# Patient Record
Sex: Female | Born: 1935 | Race: White | Hispanic: No | Smoking: Current every day smoker
Health system: Southern US, Community
[De-identification: ages and names within clinical notes are randomized; demographics above are authoritative.]

## PROBLEM LIST (undated history)

## (undated) DIAGNOSIS — I10 Essential (primary) hypertension: Secondary | ICD-10-CM

## (undated) DIAGNOSIS — C50919 Malignant neoplasm of unspecified site of unspecified female breast: Secondary | ICD-10-CM

## (undated) DIAGNOSIS — J449 Chronic obstructive pulmonary disease, unspecified: Secondary | ICD-10-CM

## (undated) DIAGNOSIS — D510 Vitamin B12 deficiency anemia due to intrinsic factor deficiency: Secondary | ICD-10-CM

## (undated) DIAGNOSIS — E039 Hypothyroidism, unspecified: Secondary | ICD-10-CM

## (undated) HISTORY — PX: TONSILLECTOMY: SUR1361

## (undated) HISTORY — DX: Essential (primary) hypertension: I10

## (undated) HISTORY — DX: Chronic obstructive pulmonary disease, unspecified: J44.9

## (undated) HISTORY — DX: Vitamin B12 deficiency anemia due to intrinsic factor deficiency: D51.0

## (undated) HISTORY — DX: Malignant neoplasm of unspecified site of unspecified female breast: C50.919

## (undated) HISTORY — PX: MASTECTOMY: SHX3

## (undated) HISTORY — DX: Hypothyroidism, unspecified: E03.9

---

## 2014-05-22 LAB — LIPID PANEL
HDL: 41 mg/dL (ref 35–70)
LDL Cholesterol: 144 mg/dL
Triglycerides: 108 mg/dL (ref 40–160)

## 2014-05-22 LAB — BASIC METABOLIC PANEL
Creatinine: 1.2 mg/dL — AB (ref 0.5–1.1)
Glucose: 73 mg/dL

## 2014-05-22 LAB — CBC AND DIFFERENTIAL
HEMOGLOBIN: 14.3 g/dL (ref 12.0–16.0)
Platelets: 202 10*3/uL (ref 150–399)
WBC: 6.2 10*3/mL

## 2015-05-13 ENCOUNTER — Ambulatory Visit (INDEPENDENT_AMBULATORY_CARE_PROVIDER_SITE_OTHER): Payer: Medicare Other | Admitting: Family Medicine

## 2015-05-13 ENCOUNTER — Encounter: Payer: Self-pay | Admitting: Family Medicine

## 2015-05-13 VITALS — BP 136/74 | HR 109 | Ht 65.0 in | Wt 177.0 lb

## 2015-05-13 DIAGNOSIS — E038 Other specified hypothyroidism: Secondary | ICD-10-CM | POA: Diagnosis not present

## 2015-05-13 DIAGNOSIS — I10 Essential (primary) hypertension: Secondary | ICD-10-CM

## 2015-05-13 DIAGNOSIS — C50912 Malignant neoplasm of unspecified site of left female breast: Secondary | ICD-10-CM

## 2015-05-13 DIAGNOSIS — N289 Disorder of kidney and ureter, unspecified: Secondary | ICD-10-CM

## 2015-05-13 DIAGNOSIS — E039 Hypothyroidism, unspecified: Secondary | ICD-10-CM | POA: Insufficient documentation

## 2015-05-13 DIAGNOSIS — C50919 Malignant neoplasm of unspecified site of unspecified female breast: Secondary | ICD-10-CM

## 2015-05-13 DIAGNOSIS — D510 Vitamin B12 deficiency anemia due to intrinsic factor deficiency: Secondary | ICD-10-CM | POA: Diagnosis not present

## 2015-05-13 DIAGNOSIS — R221 Localized swelling, mass and lump, neck: Secondary | ICD-10-CM

## 2015-05-13 HISTORY — DX: Malignant neoplasm of unspecified site of unspecified female breast: C50.919

## 2015-05-13 HISTORY — DX: Vitamin B12 deficiency anemia due to intrinsic factor deficiency: D51.0

## 2015-05-13 NOTE — Progress Notes (Signed)
CC: Alexandria Pittman is a 79 y.o. female is here for Establish Care   Subjective: HPI:  Very pleasant 79 year old here to establish care  History of essential hypertension currently taking amlodipine and hydrochlorothiazide. She would like to know she can stop either of these medications. She denies any known side effects, she finds taking medication annoying.  History of hypothyroidism stemming back an unknown amount of years. She is taking 100 MCG of levothyroxine, she believes back in February she was switched from 80 MCG daily due to abnormal labs. She's had some swelling in the right anterior neck that her daughter has also noticed. Patient tells me that if her head is not directed forward she has difficulty swallowing. She is uncertain how long the swelling and present it seems to be painless. It seems to be growing.  History of pernicious anemia currently seen hematologist to get B12 injections. Hemoglobin back in June was normal  History of breast cancer requiring a mastectomy in 2014. She was only able to complete 8 out of 15 cycles of chemotherapy due to malnourishment and severe dehydration. Ever since chemotherapy she's noticed that her short-term memory is impaired. Short-term memory does not seem to be declining, current impairment seems to be fixed and unwavering.  Report a history of renal insufficiency and tumor requiring radioablation on the left kidney many years ago. Family is requesting that liver function and kidney function be checked today.   Review of Systems - General ROS: negative for - chills, fever, night sweats, weight gain or weight loss Ophthalmic ROS: negative for - decreased vision Psychological ROS: negative for - anxiety or depression ENT ROS: negative for - hearing change, nasal congestion, tinnitus or allergies Hematological and Lymphatic ROS: negative for - bleeding problems, bruising or swollen lymph nodes Breast ROS: negative Respiratory ROS: no cough,  shortness of breath, or wheezing Cardiovascular ROS: no chest pain or dyspnea on exertion Gastrointestinal ROS: no abdominal pain, change in bowel habits, or black or bloody stools Genito-Urinary ROS: negative for - genital discharge, genital ulcers, incontinence or abnormal bleeding from genitals Musculoskeletal ROS: negative for - joint pain or muscle pain Neurological ROS: negative for - headaches or memory loss Dermatological ROS: negative for lumps, mole changes, rash and skin lesion changes  Past Medical History  Diagnosis Date  . Pernicious anemia 05/13/2015  . Breast cancer, female 05/13/2015    No past surgical history on file. No family history on file.  History   Social History  . Marital Status: N/A    Spouse Name: N/A  . Number of Children: N/A  . Years of Education: N/A   Occupational History  . Not on file.   Social History Main Topics  . Smoking status: Current Every Day Smoker  . Smokeless tobacco: Not on file  . Alcohol Use: Not on file  . Drug Use: Not on file  . Sexual Activity: Not on file   Other Topics Concern  . Not on file   Social History Narrative  . No narrative on file     Objective: BP 136/74 mmHg  Pulse 109  Ht 5\' 5"  (1.651 m)  Wt 177 lb (80.287 kg)  BMI 29.45 kg/m2  General: Alert and Oriented, No Acute Distress HEENT: Pupils equal, round, reactive to light. Conjunctivae clear.  Moist mucous membranes, pharynx without inflammation nor lesions. Midline trachea, firm peanut-sized mass just above the right medial clavicle  Lungs: Clear to auscultation bilaterally, no wheezing/ronchi/rales.  Comfortable work of breathing. Good air  movement. Cardiac: Regular rate and rhythm. Normal S1/S2.  No murmurs, rubs, nor gallops.   Extremities: No peripheral edema.  Strong peripheral pulses.  Mental Status: No depression, anxiety, nor agitation. Skin: Warm and dry.  Assessment & Plan: Alexandria Pittman was seen today for establish care.  Diagnoses and all  orders for this visit:  Essential hypertension Orders: -     COMPLETE METABOLIC PANEL WITH GFR  Other specified hypothyroidism Orders: -     TSH  Pernicious anemia  Breast cancer, female, left  Neck mass Orders: -     US Soft Tissue Head/Neck  Renal insufficiency Orders: -     COMPLETE METABOLIC PANEL WITH GFR   Essential hypertension: Control, checking renal function, reassurance provided that HCTZ and amlodipine are doing their job at keeping her blood pressure under 140/90, encouraged to continue current regimen Hypothyroidism: Due for TSH continue 100 MCG of the proximal and pending results Pernicious anemia: Managed by hematology History of breast cancer: Up-to-date on mammogram as of the beginning of this year Neck mass: Obtain ultrasound of the neck, discussed importance of this Renal insufficiency: Checking renal function.  58minutes spent face-to-face during visit today of which at least 50% was counseling or coordinating care regarding: 1. Essential hypertension   2. Other specified hypothyroidism   3. Pernicious anemia   4. Breast cancer, female, left   5. Neck mass   6. Renal insufficiency      Return in about 3 months (around 08/13/2015).

## 2015-05-16 DIAGNOSIS — H3532 Exudative age-related macular degeneration: Secondary | ICD-10-CM | POA: Diagnosis not present

## 2015-05-16 DIAGNOSIS — H3531 Nonexudative age-related macular degeneration: Secondary | ICD-10-CM | POA: Diagnosis not present

## 2015-05-23 ENCOUNTER — Encounter: Payer: Self-pay | Admitting: Family Medicine

## 2015-05-23 DIAGNOSIS — I10 Essential (primary) hypertension: Secondary | ICD-10-CM | POA: Diagnosis not present

## 2015-05-23 DIAGNOSIS — N289 Disorder of kidney and ureter, unspecified: Secondary | ICD-10-CM | POA: Diagnosis not present

## 2015-05-23 DIAGNOSIS — E038 Other specified hypothyroidism: Secondary | ICD-10-CM | POA: Diagnosis not present

## 2015-05-23 DIAGNOSIS — J449 Chronic obstructive pulmonary disease, unspecified: Secondary | ICD-10-CM | POA: Insufficient documentation

## 2015-05-24 LAB — COMPLETE METABOLIC PANEL WITH GFR
ALK PHOS: 78 U/L (ref 33–130)
ALT: 10 U/L (ref 6–29)
AST: 20 U/L (ref 10–35)
Albumin: 4 g/dL (ref 3.6–5.1)
BUN: 15 mg/dL (ref 7–25)
CALCIUM: 8.9 mg/dL (ref 8.6–10.4)
CO2: 26 mmol/L (ref 20–31)
CREATININE: 1.09 mg/dL — AB (ref 0.60–0.93)
Chloride: 102 mmol/L (ref 98–110)
GFR, Est African American: 57 mL/min — ABNORMAL LOW (ref >=60)
GFR, Est Non African American: 49 mL/min — ABNORMAL LOW (ref >=60)
Glucose, Bld: 94 mg/dL (ref 65–99)
Potassium: 3.8 mmol/L (ref 3.5–5.3)
Sodium: 142 mmol/L (ref 135–146)
Total Bilirubin: 0.5 mg/dL (ref 0.2–1.2)
Total Protein: 6.9 g/dL (ref 6.1–8.1)

## 2015-05-24 LAB — TSH: TSH: 1.994 u[IU]/mL (ref 0.350–4.500)

## 2015-05-27 ENCOUNTER — Telehealth: Payer: Self-pay | Admitting: Family Medicine

## 2015-05-27 MED ORDER — LEVOTHYROXINE SODIUM 100 MCG PO TABS: ORAL_TABLET | ORAL | Status: DC

## 2015-05-27 NOTE — Telephone Encounter (Signed)
Amber, Will you please let patient know that her kidney function is stable and her thyroid supplement appears to be adequate therefore I've [facility] in [facility] [address].  F/U in three months.

## 2015-05-28 NOTE — Telephone Encounter (Signed)
Pt notified of results & rx refills.

## 2015-05-31 ENCOUNTER — Ambulatory Visit (INDEPENDENT_AMBULATORY_CARE_PROVIDER_SITE_OTHER): Payer: Medicare Other | Admitting: Sports Medicine

## 2015-05-31 ENCOUNTER — Encounter: Payer: Self-pay | Admitting: Sports Medicine

## 2015-05-31 VITALS — BP 149/85 | HR 105 | Wt 177.0 lb

## 2015-05-31 DIAGNOSIS — M7742 Metatarsalgia, left foot: Secondary | ICD-10-CM | POA: Diagnosis not present

## 2015-05-31 DIAGNOSIS — M7741 Metatarsalgia, right foot: Secondary | ICD-10-CM

## 2015-05-31 DIAGNOSIS — R4182 Altered mental status, unspecified: Secondary | ICD-10-CM | POA: Diagnosis not present

## 2015-05-31 NOTE — Progress Notes (Signed)

## 2015-05-31 NOTE — Assessment & Plan Note (Addendum)
Minimal confusion, adding urinalysis. TSH was normal recently.

## 2015-05-31 NOTE — Assessment & Plan Note (Signed)
Custom orthotics with metatarsal pads.  Return in 4 weeks to recheck.

## 2015-05-31 NOTE — Addendum Note (Signed)
Addended by: Silverio Decamp on: 05/31/2015 02:17 PM   Modules accepted: Orders

## 2015-06-05 DIAGNOSIS — D510 Vitamin B12 deficiency anemia due to intrinsic factor deficiency: Secondary | ICD-10-CM | POA: Diagnosis not present

## 2015-06-28 ENCOUNTER — Ambulatory Visit (INDEPENDENT_AMBULATORY_CARE_PROVIDER_SITE_OTHER): Payer: Medicare Other | Admitting: Sports Medicine

## 2015-06-28 ENCOUNTER — Encounter: Payer: Self-pay | Admitting: Sports Medicine

## 2015-06-28 VITALS — BP 134/57 | HR 101 | Wt 178.0 lb

## 2015-06-28 DIAGNOSIS — M7742 Metatarsalgia, left foot: Secondary | ICD-10-CM

## 2015-06-28 DIAGNOSIS — M7741 Metatarsalgia, right foot: Secondary | ICD-10-CM | POA: Diagnosis not present

## 2015-06-28 NOTE — Progress Notes (Signed)
  Subjective:    CC: Follow-up  HPI: This is a pleasant 79 year old female, we will custom orthotics with metatarsal pads at the last visit and she returns today with pain only 30% of where was, and continuing to improve, symptoms are mild and improving, she wears the orthotics every day.  Past medical history, Surgical history, Family history not pertinant except as noted below, Social history, Allergies, and medications have been entered into the medical record, reviewed, and no changes needed.   Review of Systems: No fevers, chills, night sweats, weight loss, chest pain, or shortness of breath.   Objective:    General: Well Developed, well nourished, and in no acute distress.  Neuro: Alert and oriented x3, extra-ocular muscles intact, sensation grossly intact.  HEENT: Normocephalic, atraumatic, pupils equal round reactive to light, neck supple, no masses, no lymphadenopathy, thyroid nonpalpable.  Skin: Warm and dry, no rashes. Cardiac: Regular rate and rhythm, no murmurs rubs or gallops, no lower extremity edema.  Respiratory: Clear to auscultation bilaterally. Not using accessory muscles, speaking in full sentences. Bilateral feet: No visible erythema or swelling. Range of motion is full in all directions. Strength is 5/5 in all directions. No hallux valgus. No pes cavus or pes planus. No abnormal callus noted. No pain over the navicular prominence, or base of fifth metatarsal. No tenderness to palpation of the calcaneal insertion of plantar fascia. No pain at the Achilles insertion. No pain over the calcaneal bursa. No pain of the retrocalcaneal bursa. No tenderness to palpation over the tarsals, metatarsals, or phalanges. No hallux rigidus or limitus. No tenderness palpation over interphalangeal joints. No pain with compression of the metatarsal heads. Neurovascularly intact distally.  Impression and Recommendations:

## 2015-06-28 NOTE — Assessment & Plan Note (Signed)
Essentially resolved and continuing to improve with custom orthotics with metatarsal pads. Return as needed.

## 2015-07-03 DIAGNOSIS — D510 Vitamin B12 deficiency anemia due to intrinsic factor deficiency: Secondary | ICD-10-CM | POA: Diagnosis not present

## 2015-07-31 DIAGNOSIS — D510 Vitamin B12 deficiency anemia due to intrinsic factor deficiency: Secondary | ICD-10-CM | POA: Diagnosis not present

## 2015-08-02 ENCOUNTER — Encounter: Payer: Self-pay | Admitting: Family Medicine

## 2015-08-16 ENCOUNTER — Ambulatory Visit (INDEPENDENT_AMBULATORY_CARE_PROVIDER_SITE_OTHER): Payer: Medicare Other | Admitting: Family Medicine

## 2015-08-16 VITALS — BP 155/81 | HR 109 | Wt 178.0 lb

## 2015-08-16 DIAGNOSIS — H918X1 Other specified hearing loss, right ear: Secondary | ICD-10-CM

## 2015-08-16 DIAGNOSIS — H6121 Impacted cerumen, right ear: Secondary | ICD-10-CM

## 2015-08-16 DIAGNOSIS — I10 Essential (primary) hypertension: Secondary | ICD-10-CM

## 2015-08-16 DIAGNOSIS — R221 Localized swelling, mass and lump, neck: Secondary | ICD-10-CM | POA: Diagnosis not present

## 2015-08-16 MED ORDER — AMLODIPINE BESYLATE 5 MG PO TABS: 5.0000 mg | ORAL_TABLET | Freq: Every day | ORAL | Status: DC

## 2015-08-16 MED ORDER — HYDROCHLOROTHIAZIDE 25 MG PO TABS
25.0000 mg | ORAL_TABLET | Freq: Every day | ORAL | Status: DC
Start: 2015-08-16 — End: 2015-11-22

## 2015-08-16 MED ORDER — LEVOTHYROXINE SODIUM 100 MCG PO TABS: ORAL_TABLET | ORAL | Status: DC

## 2015-08-18 ENCOUNTER — Encounter: Payer: Self-pay | Admitting: Family Medicine

## 2015-08-18 NOTE — Progress Notes (Signed)
CC: Alexandria Pittman is a 79 y.o. female is here for Follow-up; Right Ear Fullness; and Weakness   Subjective: HPI:  Essential hypertension: 75% compliance with hydrochlorothiazide and amlodipine. No known side effects. Denies any chest pain shortness of breath orthopnea nor peripheral edema. No outside blood pressures report  Continues to have trouble swallowing if looking to the right or left. If she is looking forward she has no difficulty swallowing. Denies any choking or aspiration. She does not believe that the mass on her neck has gotten any smaller or larger since I saw her last and she denies being contacted by radiology for scheduling the ultrasound ordered last visit  Complains of hearing loss in the right ear, mild in severity with a fullness sensation. She denies any headaches, fevers, nasal congestion. No interventions as of yet. Slowly worsening and mild in severity. No balance issues or discharge from the ear.   Review Of Systems Outlined In HPI  Past Medical History  Diagnosis Date  . Pernicious anemia 05/13/2015  . Breast cancer, female 05/13/2015    Past Surgical History  Procedure Laterality Date  . Mastectomy Left    No family history on file.  Social History   Social History  . Marital Status: Unknown    Spouse Name: N/A  . Number of Children: N/A  . Years of Education: N/A   Occupational History  . Not on file.   Social History Main Topics  . Smoking status: Current Every Day Smoker  . Smokeless tobacco: Not on file  . Alcohol Use: Not on file  . Drug Use: Not on file  . Sexual Activity: Not on file   Other Topics Concern  . Not on file   Social History Narrative     Objective: BP 155/81 mmHg  Pulse 109  Wt 178 lb (80.74 kg)  General: Alert and Oriented, No Acute Distress HEENT: Pupils equal, round, reactive to light. Conjunctivae clear.  External ears unremarkable, left canal clear with intact TMs with appropriate landmarks. Right external ear  canal has a cerumen impaction deep in the canal. Middle ear appears open without effusion. Pink inferior turbinates.  Moist mucous membranes, pharynx without inflammation nor lesions.  Lungs: Clear to auscultation bilaterally, no wheezing/ronchi/rales.  Comfortable work of breathing. Good air movement. Cardiac: Regular rate and rhythm. Normal S1/S2.  No murmurs, rubs, nor gallops.   Extremities: No peripheral edema.  Strong peripheral pulses.  Mental Status: No depression, anxiety, nor agitation. Skin: Warm and dry.  Assessment & Plan: Alexandria Pittman was seen today for follow-up, right ear fullness and weakness.  Diagnoses and all orders for this visit:  Essential hypertension -     amLODipine (NORVASC) 5 MG tablet; Take 1 tablet (5 mg total) by mouth daily. -     hydrochlorothiazide (HYDRODIURIL) 25 MG tablet; Take 1 tablet (25 mg total) by mouth daily. -     Lipid panel  Neck mass  Hearing loss of right ear due to cerumen impaction  Other orders -     levothyroxine (SYNTHROID, LEVOTHROID) 100 MCG tablet; TAKE 1 TABLET (100 MCG TOTAL) BY MOUTH DAILY.   Essential hypertension: Uncontrolled due to compliance, discussed ways to help with 100% compliance such as letting her daughter take care of medications and making a pill container with every days medication refilled weekly. Neck mass: Give directions to her radiology office so they can walk down there today and schedule an appointment for the ultrasound of the neck already ordered. Hearing loss in  the right ear due to cerumen impaction. I asked her to use hydrogen peroxide in the right ear for 5 minutes every day for the next week and return if hearing has not returned so we can get it out with lavage and curettage, did not have time today to provide this service.  Return in about 3 months (around 11/16/2015).

## 2015-08-22 DIAGNOSIS — I10 Essential (primary) hypertension: Secondary | ICD-10-CM | POA: Diagnosis not present

## 2015-08-23 ENCOUNTER — Telehealth: Payer: Self-pay | Admitting: Family Medicine

## 2015-08-23 DIAGNOSIS — E785 Hyperlipidemia, unspecified: Secondary | ICD-10-CM | POA: Insufficient documentation

## 2015-08-23 LAB — LIPID PANEL
CHOLESTEROL: 195 mg/dL (ref 125–200)
HDL: 41 mg/dL — ABNORMAL LOW (ref >=46)
LDL CALC: 134 mg/dL — AB (ref <130)
TRIGLYCERIDES: 101 mg/dL (ref <150)
Total CHOL/HDL Ratio: 4.8 Ratio (ref <=5.0)
VLDL: 20 mg/dL (ref <30)

## 2015-08-23 MED ORDER — ATORVASTATIN CALCIUM 20 MG PO TABS: 20.0000 mg | ORAL_TABLET | Freq: Every day | ORAL | Status: DC

## 2015-08-23 NOTE — Telephone Encounter (Signed)
Pt.notified

## 2015-08-23 NOTE — Telephone Encounter (Signed)
Will you please let patient know that her LDL cholesterol was elevated to a degree that I'd recommend she start taking a cholesterol lowering medication called atorvastatin.  [facility]

## 2015-09-12 ENCOUNTER — Ambulatory Visit (INDEPENDENT_AMBULATORY_CARE_PROVIDER_SITE_OTHER): Payer: Medicare Other

## 2015-09-12 DIAGNOSIS — R221 Localized swelling, mass and lump, neck: Secondary | ICD-10-CM | POA: Diagnosis not present

## 2015-10-09 DIAGNOSIS — D649 Anemia, unspecified: Secondary | ICD-10-CM | POA: Diagnosis not present

## 2015-10-09 DIAGNOSIS — D510 Vitamin B12 deficiency anemia due to intrinsic factor deficiency: Secondary | ICD-10-CM | POA: Diagnosis not present

## 2015-10-09 DIAGNOSIS — Z9221 Personal history of antineoplastic chemotherapy: Secondary | ICD-10-CM | POA: Diagnosis not present

## 2015-10-09 DIAGNOSIS — Z720 Tobacco use: Secondary | ICD-10-CM | POA: Diagnosis not present

## 2015-10-09 DIAGNOSIS — F17200 Nicotine dependence, unspecified, uncomplicated: Secondary | ICD-10-CM | POA: Diagnosis not present

## 2015-10-09 DIAGNOSIS — C50912 Malignant neoplasm of unspecified site of left female breast: Secondary | ICD-10-CM | POA: Diagnosis not present

## 2015-11-15 ENCOUNTER — Ambulatory Visit: Payer: Medicare Other | Admitting: Family Medicine

## 2015-11-22 ENCOUNTER — Ambulatory Visit (INDEPENDENT_AMBULATORY_CARE_PROVIDER_SITE_OTHER): Payer: Medicare Other | Admitting: Family Medicine

## 2015-11-22 ENCOUNTER — Encounter: Payer: Self-pay | Admitting: Family Medicine

## 2015-11-22 VITALS — BP 143/81 | HR 107 | Wt 179.0 lb

## 2015-11-22 DIAGNOSIS — M1990 Unspecified osteoarthritis, unspecified site: Secondary | ICD-10-CM | POA: Diagnosis not present

## 2015-11-22 DIAGNOSIS — G4700 Insomnia, unspecified: Secondary | ICD-10-CM | POA: Diagnosis not present

## 2015-11-22 DIAGNOSIS — H3530 Unspecified macular degeneration: Secondary | ICD-10-CM | POA: Diagnosis not present

## 2015-11-22 DIAGNOSIS — I10 Essential (primary) hypertension: Secondary | ICD-10-CM

## 2015-11-22 NOTE — Progress Notes (Signed)
CC: Alexandria Pittman is a 80 y.o. female is here for Hypertension and Extremity Weakness   Subjective: HPI:  Follow-up essential hypertension: Since I saw her last she decided to self discontinue amlodipine and hydrochlorothiazide. She denies any known side effects but wanted to see if she actually needed to be on it. She denies chest pain shortness of breath orthopnea nor peripheral edema.  She tells me that she has macular degeneration and her former ophthalmologist has "disappeared from the country ". She was set up with a new ophthalmologist at [facility] however she was having to pay 200 on her co-pay to establish with his new surgeon and she takes offense to this and does not want to ever go back to that practice. She has floaters and has had diminishing eyesight over the past 6 months since her last visit with them. Symptoms are mild in severity.  She's having difficulty with falling asleep at night. Interventions have included cutting out milk, adding chocolate ice cream to her bedtime snack and finding a comfortable place to sleep. She denies any urinary habits, pain or anxiety contributing to her insomnia. She denies any difficulty staying asleep is just falling asleep.  She tells me that she's been diagnosed with osteoarthritis in the knees and hips. She wants to know if there's an exercise program that she can start. She denies any swelling redness or warmth of any of the joints. She denies any catching giving way or locking up if any of the joints.   Review Of Systems Outlined In HPI  Past Medical History  Diagnosis Date  . Pernicious anemia 05/13/2015  . Breast cancer, female ([place]) 05/13/2015    Past Surgical History  Procedure Laterality Date  . Mastectomy Left    No family history on file.  Social History   Social History  . Marital Status: Unknown    Spouse Name: N/A  . Number of Children: N/A  . Years of Education: N/A   Occupational History  . Not on  file.   Social History Main Topics  . Smoking status: Current Every Day Smoker  . Smokeless tobacco: Not on file  . Alcohol Use: Not on file  . Drug Use: Not on file  . Sexual Activity: Not on file   Other Topics Concern  . Not on file   Social History Narrative     Objective: BP 143/81 mmHg  Pulse 107  Wt 179 lb (81.194 kg)  Vital signs reviewed. General: Alert and Oriented, No Acute Distress HEENT: Pupils equal, round, reactive to light. Conjunctivae clear.  External ears unremarkable.  Moist mucous membranes. Lungs: Clear and comfortable work of breathing, speaking in full sentences without accessory muscle use. Cardiac: Regular rate and rhythm.  Neuro: CN II-XII grossly intact, gait normal. Extremities: No peripheral edema.  Strong peripheral pulses.  Mental Status: No depression, anxiety, nor agitation. Logical though process. Skin: Warm and dry.  Assessment & Plan: Alexandria Pittman was seen today for hypertension and extremity weakness.  Diagnoses and all orders for this visit:  Essential hypertension  Macular degeneration -     Ambulatory referral to Ophthalmology  Insomnia  Osteoarthritis, unspecified osteoarthritis type, unspecified site   Essential hypertension: Uncontrolled chronic condition off of both hydrochlorothiazide and amlodipine, fortunately and with intention is to be on amlodipine. Restarting this today do not restart hydrochlorothiazide. Macular degeneration: Referral was placed to [facility] office since she does not want follow-up with her former practice. Insomnia: Discussed starting on melatonin  every evening and cutting back on sugars and caffeine (example: Chocolate ice cream) 4 hours prior to anticipated bedtime. Osteoarthritis: Discussed the benefits of water aerobics with respect to her overall fitness, cardiovascular health and joint pain.  40 minutes spent face-to-face during visit today of which at least 50% was counseling or coordinating care  regarding: 1. Essential hypertension   2. Macular degeneration   3. Insomnia   4. Osteoarthritis, unspecified osteoarthritis type, unspecified site      Return in about 3 months (around 02/19/2016).

## 2015-12-17 ENCOUNTER — Telehealth: Payer: Self-pay

## 2015-12-17 NOTE — Telephone Encounter (Signed)
Pt said that during her appointment on 11/22/15 you stated that you wanted to test her stool for cancer.  I don't see any orders for any stool cultures.

## 2015-12-18 NOTE — Telephone Encounter (Signed)
Order faxed pt notified.

## 2015-12-18 NOTE — Telephone Encounter (Signed)
Stool cultures only check for pathologic bacteria in the feces, I think she's referring to cologuard.   Can you please complete the order for this now in your in box and notify the patient/family.

## 2015-12-20 DIAGNOSIS — H353211 Exudative age-related macular degeneration, right eye, with active choroidal neovascularization: Secondary | ICD-10-CM | POA: Diagnosis not present

## 2015-12-20 DIAGNOSIS — H2513 Age-related nuclear cataract, bilateral: Secondary | ICD-10-CM | POA: Diagnosis not present

## 2015-12-20 DIAGNOSIS — H40003 Preglaucoma, unspecified, bilateral: Secondary | ICD-10-CM | POA: Diagnosis not present

## 2015-12-20 DIAGNOSIS — H3530 Unspecified macular degeneration: Secondary | ICD-10-CM | POA: Diagnosis not present

## 2015-12-20 DIAGNOSIS — H353221 Exudative age-related macular degeneration, left eye, with active choroidal neovascularization: Secondary | ICD-10-CM | POA: Diagnosis not present

## 2015-12-26 DIAGNOSIS — H353221 Exudative age-related macular degeneration, left eye, with active choroidal neovascularization: Secondary | ICD-10-CM | POA: Diagnosis not present

## 2016-01-10 ENCOUNTER — Other Ambulatory Visit: Payer: Self-pay | Admitting: Family Medicine

## 2016-01-23 DIAGNOSIS — H353211 Exudative age-related macular degeneration, right eye, with active choroidal neovascularization: Secondary | ICD-10-CM | POA: Diagnosis not present

## 2016-01-23 DIAGNOSIS — H353221 Exudative age-related macular degeneration, left eye, with active choroidal neovascularization: Secondary | ICD-10-CM | POA: Diagnosis not present

## 2016-01-23 DIAGNOSIS — H3530 Unspecified macular degeneration: Secondary | ICD-10-CM | POA: Diagnosis not present

## 2016-02-06 DIAGNOSIS — H353221 Exudative age-related macular degeneration, left eye, with active choroidal neovascularization: Secondary | ICD-10-CM | POA: Diagnosis not present

## 2016-02-06 DIAGNOSIS — H40033 Anatomical narrow angle, bilateral: Secondary | ICD-10-CM | POA: Diagnosis not present

## 2016-02-06 DIAGNOSIS — H2513 Age-related nuclear cataract, bilateral: Secondary | ICD-10-CM | POA: Diagnosis not present

## 2016-02-06 DIAGNOSIS — H353211 Exudative age-related macular degeneration, right eye, with active choroidal neovascularization: Secondary | ICD-10-CM | POA: Diagnosis not present

## 2016-02-06 DIAGNOSIS — H40053 Ocular hypertension, bilateral: Secondary | ICD-10-CM | POA: Diagnosis not present

## 2016-02-21 ENCOUNTER — Encounter: Payer: Self-pay | Admitting: Family Medicine

## 2016-02-21 ENCOUNTER — Ambulatory Visit (INDEPENDENT_AMBULATORY_CARE_PROVIDER_SITE_OTHER): Payer: Medicare Other | Admitting: Family Medicine

## 2016-02-21 VITALS — BP 160/80 | HR 108 | Wt 175.0 lb

## 2016-02-21 DIAGNOSIS — E038 Other specified hypothyroidism: Secondary | ICD-10-CM | POA: Diagnosis not present

## 2016-02-21 DIAGNOSIS — J449 Chronic obstructive pulmonary disease, unspecified: Secondary | ICD-10-CM

## 2016-02-21 DIAGNOSIS — I10 Essential (primary) hypertension: Secondary | ICD-10-CM | POA: Diagnosis not present

## 2016-02-21 LAB — TSH: TSH: 1.93 mIU/L

## 2016-02-21 MED ORDER — AMLODIPINE BESYLATE 10 MG PO TABS: ORAL_TABLET | ORAL | Status: DC

## 2016-02-21 NOTE — Progress Notes (Signed)
CC: Alexandria Pittman is a 80 y.o. female is here for Hypertension   Subjective: HPI:  Mild essential hypertension: Taking amlodipine 5 mg daily. No outside blood pressures report. No chest pain shortness of breath orthopnea nor peripheral edema.  Follow-up COPD: No recent cough wheezing or shortness of breath. Not having to use any inhalers.  Follow-up hyperthyroidism: Taking levothyroxine daily with no missed doses. Denies any unintentional weight loss or gain.   Review Of Systems Outlined In HPI  Past Medical History  Diagnosis Date  . Pernicious anemia 05/13/2015  . Breast cancer, female ([place]) 05/13/2015    Past Surgical History  Procedure Laterality Date  . Mastectomy Left    No family history on file.  Social History   Social History  . Marital Status: Unknown    Spouse Name: N/A  . Number of Children: N/A  . Years of Education: N/A   Occupational History  . Not on file.   Social History Main Topics  . Smoking status: Current Every Day Smoker  . Smokeless tobacco: Not on file  . Alcohol Use: Not on file  . Drug Use: Not on file  . Sexual Activity: Not on file   Other Topics Concern  . Not on file   Social History Narrative     Objective: BP 160/80 mmHg  Pulse 108  Wt 175 lb (79.379 kg)  Vital signs reviewed. General: Alert and Oriented, No Acute Distress HEENT: Pupils equal, round, reactive to light. Conjunctivae clear.  External ears unremarkable.  Moist mucous membranes. Lungs: Clear and comfortable work of breathing, speaking in full sentences without accessory muscle use. Cardiac: Regular rate and rhythm.  Neuro: CN II-XII grossly intact, gait normal. Extremities: No peripheral edema.  Strong peripheral pulses.  Mental Status: No depression, anxiety, nor agitation. Logical though process. Skin: Warm and dry.  Assessment & Plan: Willis was seen today for hypertension.  Diagnoses and all orders for this visit:  Essential hypertension -     TSH -      amLODipine (NORVASC) 10 MG tablet; One by mouth daily, replaces 5mg  daily.  COPD, mild ([place])  Other specified hypothyroidism   Essential hypertension: Uncontrolled chronic condition increasing Norvasc COPD: Controlled, asymptomatic Hypothyroidism: Due for repeat TSH continue levothyroxine pending results  Return in about 3 months (around 05/23/2016) for HTN.

## 2016-02-24 ENCOUNTER — Telehealth: Payer: Self-pay | Admitting: Family Medicine

## 2016-02-24 DIAGNOSIS — H353231 Exudative age-related macular degeneration, bilateral, with active choroidal neovascularization: Secondary | ICD-10-CM | POA: Diagnosis not present

## 2016-02-24 DIAGNOSIS — H40053 Ocular hypertension, bilateral: Secondary | ICD-10-CM | POA: Diagnosis not present

## 2016-02-24 MED ORDER — LEVOTHYROXINE SODIUM 100 MCG PO TABS: ORAL_TABLET | ORAL | Status: DC

## 2016-02-24 NOTE — Telephone Encounter (Signed)
Results left on vm

## 2016-02-24 NOTE — Telephone Encounter (Signed)
Will you please let patient/daughter know that her thyroid supplement appears to be adequately dosed and [facility] [address] [facility].

## 2016-04-06 DIAGNOSIS — H353231 Exudative age-related macular degeneration, bilateral, with active choroidal neovascularization: Secondary | ICD-10-CM | POA: Diagnosis not present

## 2016-04-09 DIAGNOSIS — H353231 Exudative age-related macular degeneration, bilateral, with active choroidal neovascularization: Secondary | ICD-10-CM | POA: Diagnosis not present

## 2016-04-16 DIAGNOSIS — Z171 Estrogen receptor negative status [ER-]: Secondary | ICD-10-CM | POA: Diagnosis not present

## 2016-04-16 DIAGNOSIS — Z853 Personal history of malignant neoplasm of breast: Secondary | ICD-10-CM | POA: Diagnosis not present

## 2016-04-16 DIAGNOSIS — Z08 Encounter for follow-up examination after completed treatment for malignant neoplasm: Secondary | ICD-10-CM | POA: Diagnosis not present

## 2016-04-16 DIAGNOSIS — B372 Candidiasis of skin and nail: Secondary | ICD-10-CM | POA: Diagnosis not present

## 2016-04-16 DIAGNOSIS — C50812 Malignant neoplasm of overlapping sites of left female breast: Secondary | ICD-10-CM | POA: Diagnosis not present

## 2016-04-16 DIAGNOSIS — D510 Vitamin B12 deficiency anemia due to intrinsic factor deficiency: Secondary | ICD-10-CM | POA: Diagnosis not present

## 2016-04-17 DIAGNOSIS — Z171 Estrogen receptor negative status [ER-]: Secondary | ICD-10-CM | POA: Diagnosis not present

## 2016-04-17 DIAGNOSIS — D510 Vitamin B12 deficiency anemia due to intrinsic factor deficiency: Secondary | ICD-10-CM | POA: Diagnosis not present

## 2016-04-17 DIAGNOSIS — C50812 Malignant neoplasm of overlapping sites of left female breast: Secondary | ICD-10-CM | POA: Diagnosis not present

## 2016-04-30 DIAGNOSIS — D510 Vitamin B12 deficiency anemia due to intrinsic factor deficiency: Secondary | ICD-10-CM | POA: Diagnosis not present

## 2016-04-30 DIAGNOSIS — C50812 Malignant neoplasm of overlapping sites of left female breast: Secondary | ICD-10-CM | POA: Diagnosis not present

## 2016-04-30 DIAGNOSIS — Z171 Estrogen receptor negative status [ER-]: Secondary | ICD-10-CM | POA: Diagnosis not present

## 2016-05-28 DIAGNOSIS — H353211 Exudative age-related macular degeneration, right eye, with active choroidal neovascularization: Secondary | ICD-10-CM | POA: Diagnosis not present

## 2016-05-28 DIAGNOSIS — H353221 Exudative age-related macular degeneration, left eye, with active choroidal neovascularization: Secondary | ICD-10-CM | POA: Diagnosis not present

## 2016-06-04 DIAGNOSIS — H353211 Exudative age-related macular degeneration, right eye, with active choroidal neovascularization: Secondary | ICD-10-CM | POA: Diagnosis not present

## 2016-06-12 DIAGNOSIS — Z1231 Encounter for screening mammogram for malignant neoplasm of breast: Secondary | ICD-10-CM | POA: Diagnosis not present

## 2016-06-25 DIAGNOSIS — N63 Unspecified lump in breast: Secondary | ICD-10-CM | POA: Diagnosis not present

## 2016-06-25 DIAGNOSIS — C50812 Malignant neoplasm of overlapping sites of left female breast: Secondary | ICD-10-CM | POA: Diagnosis not present

## 2016-06-25 LAB — HM MAMMOGRAPHY

## 2016-07-09 DIAGNOSIS — D510 Vitamin B12 deficiency anemia due to intrinsic factor deficiency: Secondary | ICD-10-CM | POA: Diagnosis not present

## 2016-07-23 DIAGNOSIS — H353231 Exudative age-related macular degeneration, bilateral, with active choroidal neovascularization: Secondary | ICD-10-CM | POA: Diagnosis not present

## 2016-07-23 DIAGNOSIS — H40033 Anatomical narrow angle, bilateral: Secondary | ICD-10-CM | POA: Diagnosis not present

## 2016-07-23 DIAGNOSIS — H353221 Exudative age-related macular degeneration, left eye, with active choroidal neovascularization: Secondary | ICD-10-CM | POA: Diagnosis not present

## 2016-07-23 DIAGNOSIS — H2513 Age-related nuclear cataract, bilateral: Secondary | ICD-10-CM | POA: Diagnosis not present

## 2016-07-23 DIAGNOSIS — H40053 Ocular hypertension, bilateral: Secondary | ICD-10-CM | POA: Diagnosis not present

## 2016-07-24 ENCOUNTER — Ambulatory Visit: Payer: Medicare Other | Admitting: Family Medicine

## 2016-07-30 DIAGNOSIS — H353211 Exudative age-related macular degeneration, right eye, with active choroidal neovascularization: Secondary | ICD-10-CM | POA: Diagnosis not present

## 2016-08-07 ENCOUNTER — Ambulatory Visit (INDEPENDENT_AMBULATORY_CARE_PROVIDER_SITE_OTHER): Payer: Medicare Other | Admitting: Physician Assistant

## 2016-08-07 ENCOUNTER — Encounter: Payer: Self-pay | Admitting: Physician Assistant

## 2016-08-07 VITALS — BP 152/80 | HR 102 | Ht 65.0 in | Wt 171.0 lb

## 2016-08-07 DIAGNOSIS — D510 Vitamin B12 deficiency anemia due to intrinsic factor deficiency: Secondary | ICD-10-CM

## 2016-08-07 DIAGNOSIS — E039 Hypothyroidism, unspecified: Secondary | ICD-10-CM

## 2016-08-07 DIAGNOSIS — F5101 Primary insomnia: Secondary | ICD-10-CM

## 2016-08-07 DIAGNOSIS — M5136 Other intervertebral disc degeneration, lumbar region: Secondary | ICD-10-CM

## 2016-08-07 DIAGNOSIS — H6123 Impacted cerumen, bilateral: Secondary | ICD-10-CM

## 2016-08-07 DIAGNOSIS — I10 Essential (primary) hypertension: Secondary | ICD-10-CM

## 2016-08-07 DIAGNOSIS — E782 Mixed hyperlipidemia: Secondary | ICD-10-CM

## 2016-08-07 DIAGNOSIS — M51369 Other intervertebral disc degeneration, lumbar region without mention of lumbar back pain or lower extremity pain: Secondary | ICD-10-CM | POA: Insufficient documentation

## 2016-08-07 MED ORDER — TRAZODONE HCL 50 MG PO TABS: 25.0000 mg | ORAL_TABLET | Freq: Every evening | ORAL | 1 refills | Status: DC | PRN

## 2016-08-07 MED ORDER — LEVOTHYROXINE SODIUM 100 MCG PO TABS: ORAL_TABLET | ORAL | 1 refills | Status: DC

## 2016-08-07 NOTE — Patient Instructions (Signed)
START baby ASA 81mg .  Take 1/2 tablet of norvasc.

## 2016-08-09 DIAGNOSIS — H6123 Impacted cerumen, bilateral: Secondary | ICD-10-CM | POA: Insufficient documentation

## 2016-08-09 NOTE — Progress Notes (Signed)
   Subjective:    Patient ID: Alexandria Pittman, female    DOB: 1935/11/06, 80 y.o.   MRN: [id number]  HPI Pt is a 80 yo female who presents to the clinic to re-establish care.   .. Active Ambulatory Problems    Diagnosis Date Noted  . Hypothyroidism 05/13/2015  . Pernicious anemia 05/13/2015  . Breast cancer, female ([place]) 05/13/2015  . Renal insufficiency 05/13/2015  . COPD, mild ([place]) 05/23/2015  . Metatarsalgia of both feet 05/31/2015  . Altered mental status 05/31/2015  . Neck mass 08/16/2015  . Hyperlipidemia 08/23/2015  . Degenerative disc disease, lumbar 08/07/2016  . Primary insomnia 08/07/2016  . Bilateral impacted cerumen 08/09/2016   Resolved Ambulatory Problems    Diagnosis Date Noted  . Essential hypertension 05/13/2015   Past Medical History:  Diagnosis Date  . Breast cancer, female ([place]) 05/13/2015  . Pernicious anemia 05/13/2015   She is having some hearing loss and her ears feel "stuffy". No ear pain.    Review of Systems  All other systems reviewed and are negative.      Objective:   Physical Exam  Constitutional: She is oriented to person, place, and time. She appears well-developed and well-nourished.  HENT:  Head: Normocephalic and atraumatic.  Bilateral impacted cerumen. After irrigation clear TM's.   Cardiovascular: Normal rate, regular rhythm and normal heart sounds.   Pulmonary/Chest: Effort normal and breath sounds normal. She has no wheezes.  Neurological: She is alert and oriented to person, place, and time.  Psychiatric: She has a normal mood and affect. Her behavior is normal.          Assessment & Plan:  Marland KitchenMarland KitchenDiagnoses and all orders for this visit:  Hypothyroidism, unspecified type -     levothyroxine (SYNTHROID, LEVOTHROID) 100 MCG tablet; TAKE 1 TABLET (100 MCG TOTAL) BY MOUTH DAILY.  Mixed hyperlipidemia  Degenerative disc disease, lumbar  Pernicious anemia  Primary insomnia -     traZODone (DESYREL) 50 MG tablet; Take 0.5-1  tablets (25-50 mg total) by mouth at bedtime as needed for sleep.  Bilateral impacted cerumen  Essential hypertension, benign  up to date TsH. Refilled medications for 3 months.   Pt decided NOT to take statin. Discussed risk of that decision. She is aware and accepts risk.   HTN- she will start 1/2 tablet of norvasc daily. Monitor BP at home.   Marland Kitchen.Cerumen Removal Template: Indication: Cerumen impaction of the ear(s) Medical necessity statement: On physical examination, cerumen impairs clinically significant portions of the external auditory canal, and tympanic membrane. Noted obstructive, copious cerumen that cannot be removed without magnification and instrumentations requiring physician skills Consent: Discussed benefits and risks of procedure and verbal consent obtained Procedure: Patient was prepped for the procedure. Utilized an otoscope to assess and take note of the ear canal, the tympanic membrane, and the presence, amount, and placement of the cerumen. Gentle water irrigation and soft plastic curette was utilized to remove cerumen.  Post procedure examination shows cerumen was completely removed. Patient tolerated procedure well. The patient is made aware that they may experience temporary vertigo, temporary hearing loss, and temporary discomfort. If these symptom last for more than 24 hours to call the clinic or proceed to the ED.

## 2016-08-10 ENCOUNTER — Encounter: Payer: Self-pay | Admitting: Physician Assistant

## 2016-11-06 ENCOUNTER — Ambulatory Visit: Payer: Medicare Other | Admitting: Physician Assistant

## 2016-11-20 ENCOUNTER — Ambulatory Visit (INDEPENDENT_AMBULATORY_CARE_PROVIDER_SITE_OTHER): Payer: Medicare Other | Admitting: Physician Assistant

## 2016-11-20 VITALS — BP 139/80 | HR 93 | Ht 65.0 in | Wt 169.0 lb

## 2016-11-20 DIAGNOSIS — D510 Vitamin B12 deficiency anemia due to intrinsic factor deficiency: Secondary | ICD-10-CM

## 2016-11-20 DIAGNOSIS — I10 Essential (primary) hypertension: Secondary | ICD-10-CM

## 2016-11-20 DIAGNOSIS — F17200 Nicotine dependence, unspecified, uncomplicated: Secondary | ICD-10-CM | POA: Diagnosis not present

## 2016-11-20 DIAGNOSIS — Z79899 Other long term (current) drug therapy: Secondary | ICD-10-CM | POA: Diagnosis not present

## 2016-11-20 DIAGNOSIS — R21 Rash and other nonspecific skin eruption: Secondary | ICD-10-CM

## 2016-11-20 DIAGNOSIS — H6123 Impacted cerumen, bilateral: Secondary | ICD-10-CM | POA: Insufficient documentation

## 2016-11-20 DIAGNOSIS — E039 Hypothyroidism, unspecified: Secondary | ICD-10-CM

## 2016-11-20 DIAGNOSIS — J439 Emphysema, unspecified: Secondary | ICD-10-CM

## 2016-11-20 DIAGNOSIS — E559 Vitamin D deficiency, unspecified: Secondary | ICD-10-CM

## 2016-11-20 LAB — COMPLETE METABOLIC PANEL WITH GFR
ALT: 11 U/L (ref 6–29)
AST: 22 U/L (ref 10–35)
Albumin: 4.3 g/dL (ref 3.6–5.1)
Alkaline Phosphatase: 84 U/L (ref 33–130)
BILIRUBIN TOTAL: 0.5 mg/dL (ref 0.2–1.2)
BUN: 23 mg/dL (ref 7–25)
CO2: 27 mmol/L (ref 20–31)
CREATININE: 1.14 mg/dL — AB (ref 0.60–0.93)
Calcium: 9.5 mg/dL (ref 8.6–10.4)
Chloride: 106 mmol/L (ref 98–110)
GFR, Est African American: 53 mL/min — ABNORMAL LOW (ref >=60)
GFR, Est Non African American: 46 mL/min — ABNORMAL LOW (ref >=60)
GLUCOSE: 93 mg/dL (ref 65–99)
Potassium: 4.5 mmol/L (ref 3.5–5.3)
SODIUM: 141 mmol/L (ref 135–146)
TOTAL PROTEIN: 7.2 g/dL (ref 6.1–8.1)

## 2016-11-20 LAB — TSH: TSH: 3.17 m[IU]/L

## 2016-11-20 MED ORDER — AMLODIPINE BESYLATE 10 MG PO TABS: ORAL_TABLET | ORAL | 1 refills | Status: DC

## 2016-11-20 MED ORDER — LEVOTHYROXINE SODIUM 100 MCG PO TABS: ORAL_TABLET | ORAL | 1 refills | Status: DC

## 2016-11-20 MED ORDER — TRIAMCINOLONE ACETONIDE 0.1 % EX CREA: 1.0000 "application " | TOPICAL_CREAM | Freq: Two times a day (BID) | CUTANEOUS | 0 refills | Status: DC

## 2016-11-20 NOTE — Patient Instructions (Signed)
Red yeast rice 1200mg twice a day.  

## 2016-11-20 NOTE — Progress Notes (Signed)
Subjective:    Patient ID: Alexandria Pittman, female    DOB: 1936-04-08, 81 y.o.   MRN: [id number]  HPI  Pt is a 81 yo female who presents to the clinic for 6 month follow up.   Pt has COPD/emphysema but is not treated with anything expect for as needed abluterol. She feels "fine". She states she can do most things. She has continual cough. She continues to smoke. She has off and on SOB.   Pernicious anemia0- managed by Dr. Georgiann Cocker.   Hypothyroidism- taking medication. No concerns.   HTN- no CP, palpitations, headaches, dizziness. Taking norvasc daily.   She has hx of cerumen impaction. She has noticed hearing loss and needs ears irrigated.   Pt also has rash on scalp and behind bilateral ears. At times it is tender and itchy. Not tried anything on it.   Would like labs.    Review of Systems  All other systems reviewed and are negative.      Objective:   Physical Exam  Constitutional: She is oriented to person, place, and time. She appears well-developed and well-nourished.  HENT:  Head: Normocephalic and atraumatic.  Nose: Nose normal.  Mouth/Throat: Oropharynx is clear and moist. No oropharyngeal exudate.  TM's with bilateral cerumen impaction. TM's erythematous with scant blood in TM after irrigation.   Eyes: Conjunctivae are normal. Right eye exhibits no discharge. Left eye exhibits no discharge.  Neck: Normal range of motion. Neck supple.  Cardiovascular: Normal rate, regular rhythm and normal heart sounds.   Pulmonary/Chest: Effort normal and breath sounds normal.  Scattered rhonchi  Lymphadenopathy:    She has no cervical adenopathy.  Neurological: She is alert and oriented to person, place, and time.  Skin:     Psychiatric: She has a normal mood and affect. Her behavior is normal.          Assessment & Plan:  Marland KitchenMarland KitchenDiagnoses and all orders for this visit:  Essential hypertension -     amLODipine (NORVASC) 10 MG tablet; One by mouth daily, replaces 5mg   daily.  Hypothyroidism, unspecified type -     TSH -     levothyroxine (SYNTHROID, LEVOTHROID) 100 MCG tablet; TAKE 1 TABLET (100 MCG TOTAL) BY MOUTH DAILY.  Smoking -     VITAMIN D 25 Hydroxy (Vit-D Deficiency, Fractures)  Medication management -     COMPLETE METABOLIC PANEL WITH GFR  Vitamin D deficiency  Pernicious anemia -     COMPLETE METABOLIC PANEL WITH GFR -     VITAMIN D 25 Hydroxy (Vit-D Deficiency, Fractures)  Bilateral hearing loss due to cerumen impaction  Pulmonary emphysema, unspecified emphysema type (HCC)  Rash -     triamcinolone cream (KENALOG) 0.1 %; Apply 1 application topically 2 (two) times daily.   Marland Kitchen.Cerumen Removal Template: Indication: Cerumen impaction of the ear(s) Medical necessity statement: On physical examination, cerumen impairs clinically significant portions of the external auditory canal, and tympanic membrane. Noted obstructive, copious cerumen that cannot be removed without magnification and instrumentations requiring physician skills Consent: Discussed benefits and risks of procedure and verbal consent obtained Procedure: Patient was prepped for the procedure. Utilized an otoscope to assess and take note of the ear canal, the tympanic membrane, and the presence, amount, and placement of the cerumen. Gentle water irrigation and soft plastic curette was utilized to remove cerumen.  Post procedure examination shows cerumen was completely removed. Patient tolerated procedure well. The patient is made aware that they may experience temporary vertigo, temporary  hearing loss, and temporary discomfort. If these symptom last for more than 24 hours to call the clinic or proceed to the ED.  Discussed COPD and how treatment could help. Declined today.  Not interested in stopping smoking.   Rash appeared like seb dermatitis vs psoriasis. Will try triamcinolone cream.

## 2016-11-21 LAB — VITAMIN D 25 HYDROXY (VIT D DEFICIENCY, FRACTURES): Vit D, 25-Hydroxy: 33 ng/mL (ref 30–100)

## 2016-11-22 ENCOUNTER — Encounter: Payer: Self-pay | Admitting: Physician Assistant

## 2016-12-13 IMAGING — US US SOFT TISSUE HEAD/NECK
1 series · 6 of 6 positions shown · non-contrast
Comparison: None.

CLINICAL DATA: Palpable firm nodule along the right
sternoclavicular joint.

EXAM:
ULTRASOUND OF HEAD/NECK SOFT TISSUES
TECHNIQUE: Ultrasound examination of the head and neck soft tissues was
performed in the area of clinical concern.

[Series 1: us soft tissue head/neck · 0.07mm/px · 6 of 6 slices shown]
[im 1/6]
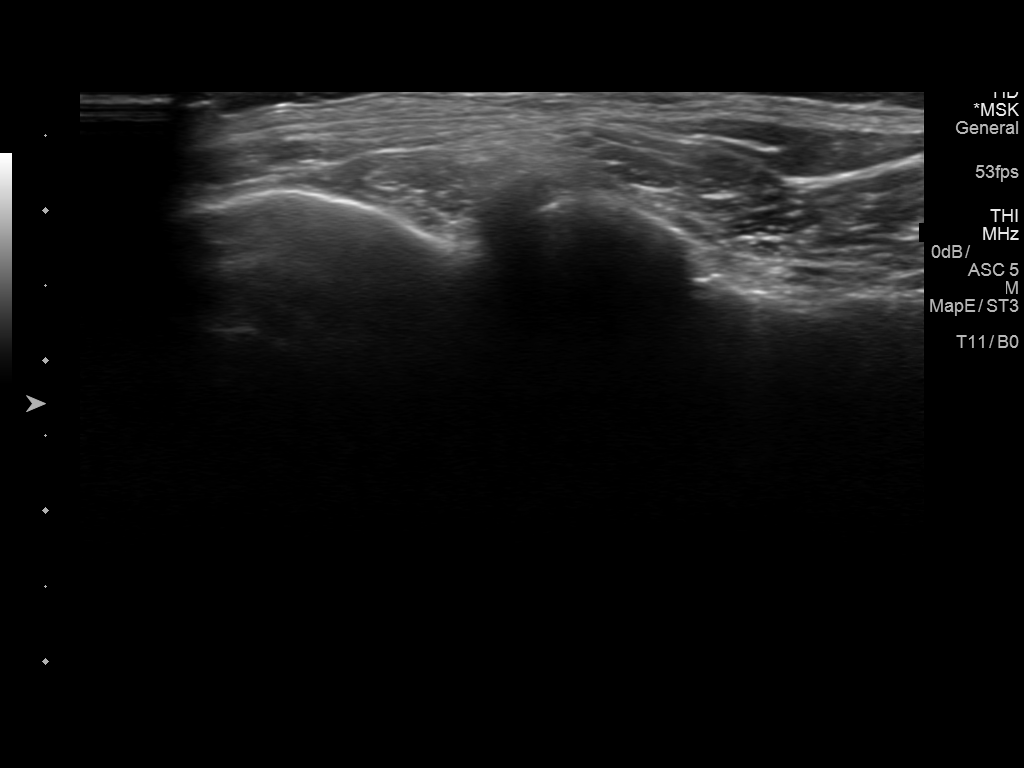
[im 2/6]
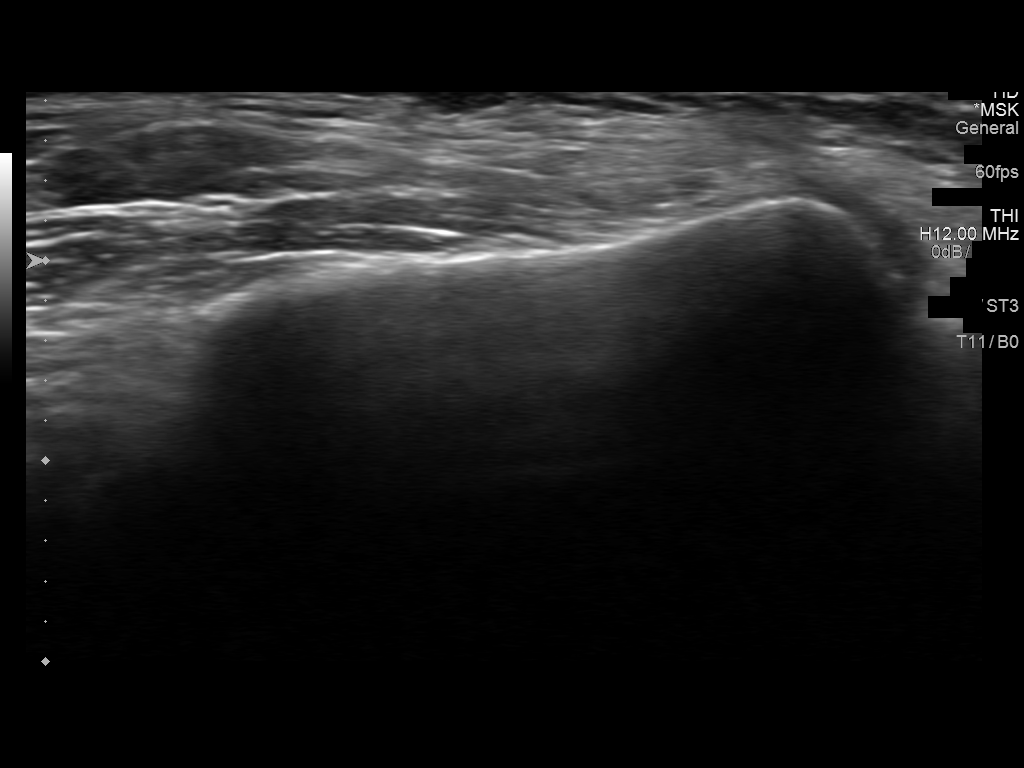
[im 3/6]
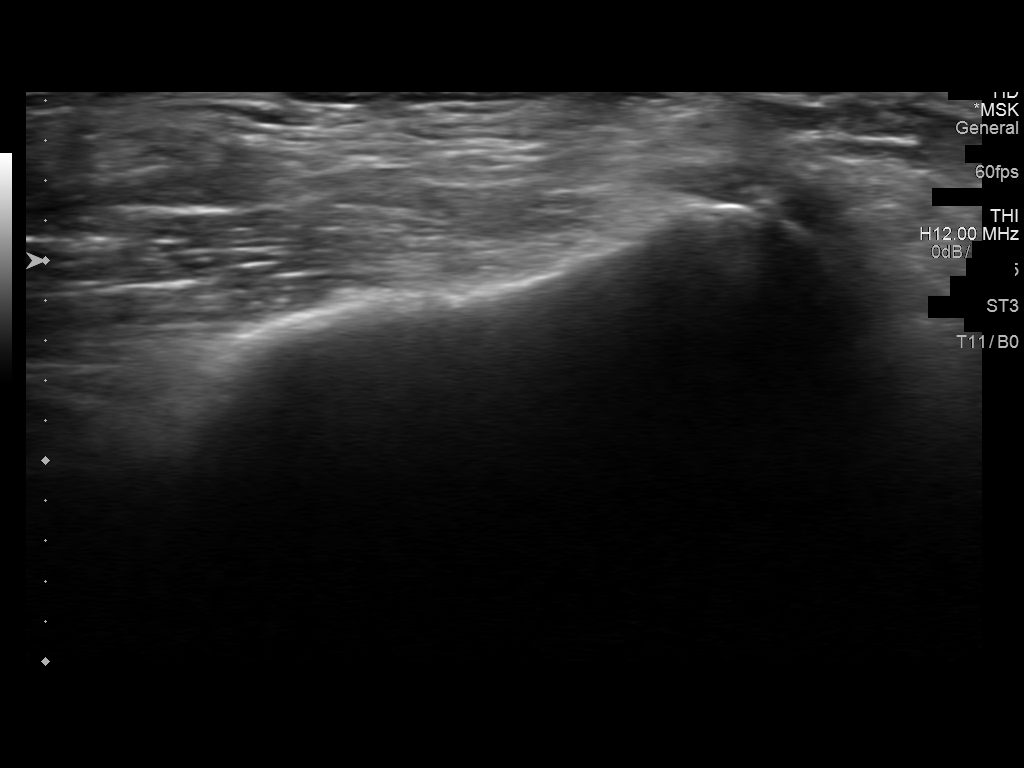
[im 4/6]
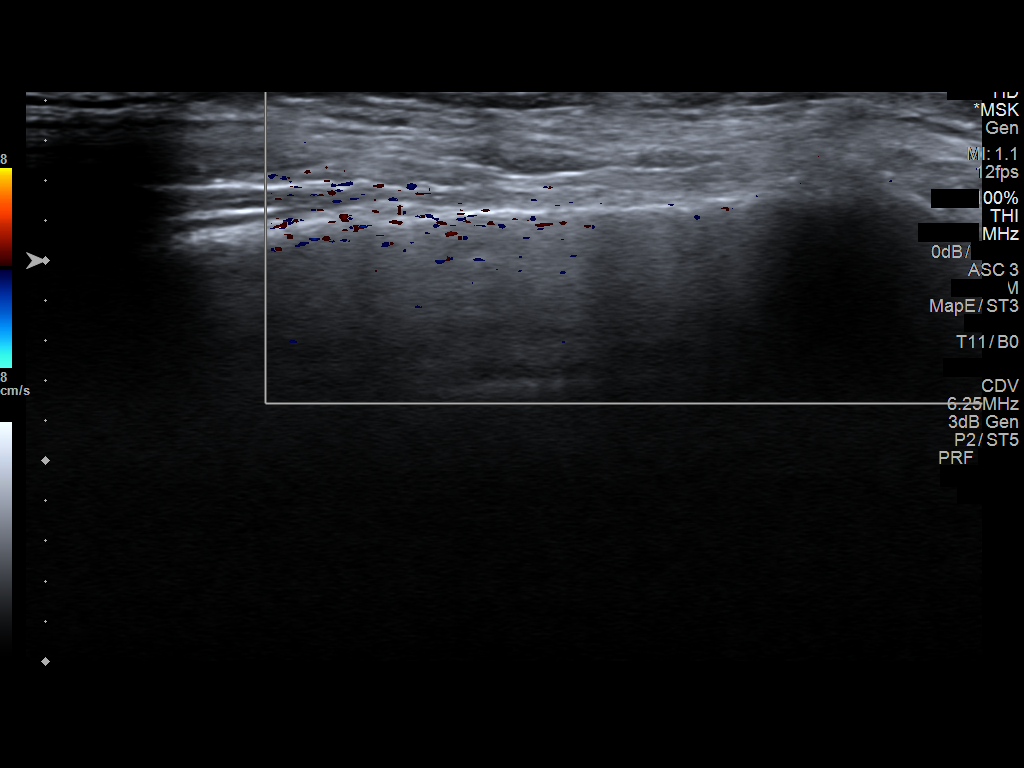
[im 5/6]
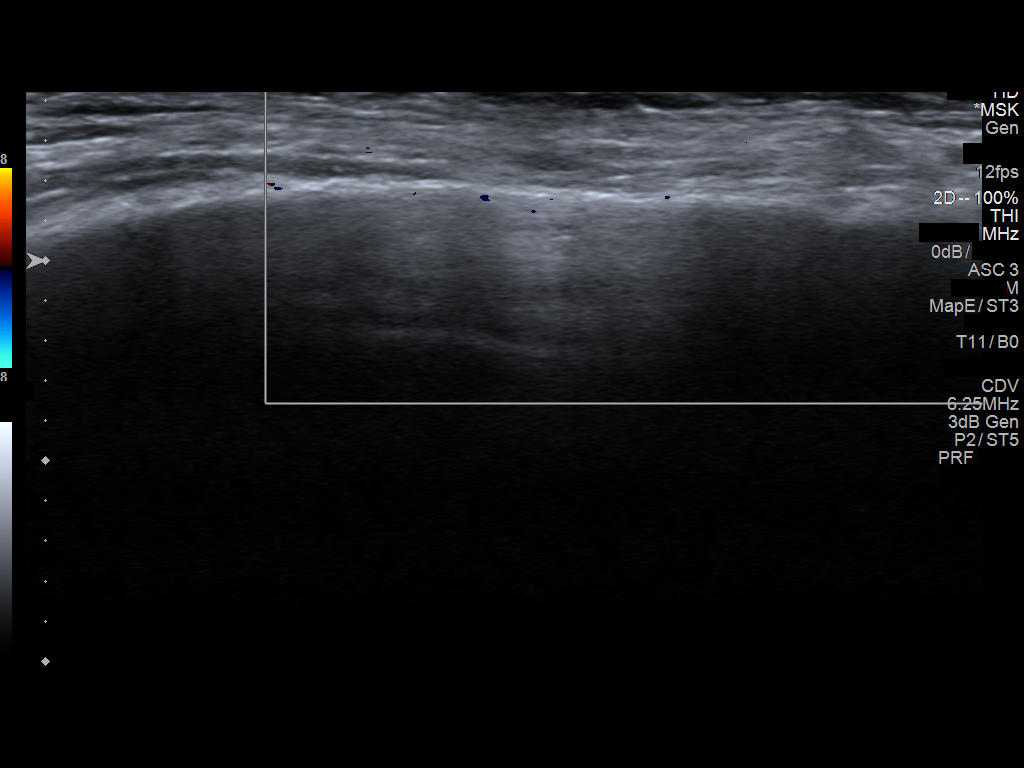
[im 6/6]
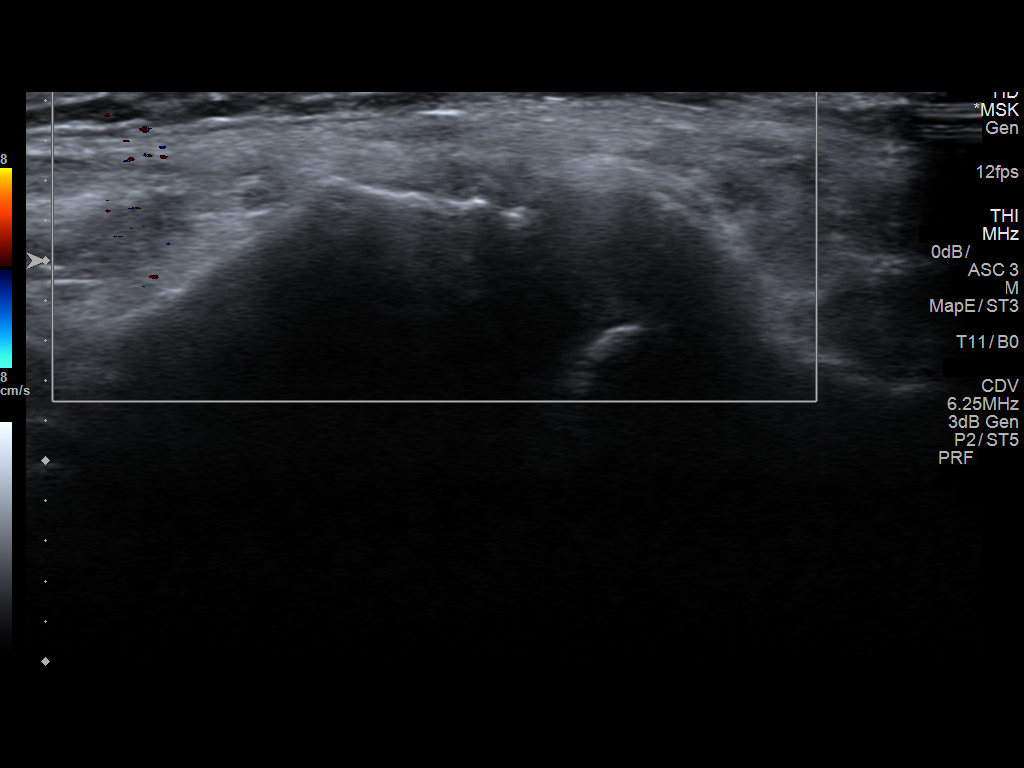

[6 of 6 positions shown; findings below may reference images not displayed]

FINDINGS: Imaging performed of the area of concern along the right
sternoclavicular joint. No significant soft tissue or subcutaneous
abnormality. No fluid collection or cyst. Palpable abnormality
appears to correlate with a prominent sternoclavicular joint.
IMPRESSION: No significant soft tissue abnormality. Prominent right
sternoclavicular joint.

## 2017-05-18 ENCOUNTER — Other Ambulatory Visit: Payer: Self-pay | Admitting: Physician Assistant

## 2017-05-18 DIAGNOSIS — R21 Rash and other nonspecific skin eruption: Secondary | ICD-10-CM

## 2017-08-03 ENCOUNTER — Other Ambulatory Visit: Payer: Self-pay | Admitting: *Deleted

## 2017-08-03 DIAGNOSIS — E039 Hypothyroidism, unspecified: Secondary | ICD-10-CM

## 2017-08-03 MED ORDER — LEVOTHYROXINE SODIUM 100 MCG PO TABS: ORAL_TABLET | ORAL | 1 refills | Status: DC

## 2017-09-01 ENCOUNTER — Other Ambulatory Visit: Payer: Self-pay | Admitting: Physician Assistant

## 2017-09-01 DIAGNOSIS — I10 Essential (primary) hypertension: Secondary | ICD-10-CM

## 2017-09-08 ENCOUNTER — Ambulatory Visit: Payer: Medicare Other | Admitting: Physician Assistant

## 2017-09-08 DIAGNOSIS — Z0189 Encounter for other specified special examinations: Secondary | ICD-10-CM

## 2018-01-26 ENCOUNTER — Encounter: Payer: Self-pay | Admitting: Physician Assistant

## 2018-01-26 ENCOUNTER — Ambulatory Visit (INDEPENDENT_AMBULATORY_CARE_PROVIDER_SITE_OTHER): Payer: Medicare Other | Admitting: Physician Assistant

## 2018-01-26 VITALS — BP 142/61 | HR 98 | Ht 65.0 in | Wt 165.0 lb

## 2018-01-26 DIAGNOSIS — G8929 Other chronic pain: Secondary | ICD-10-CM | POA: Insufficient documentation

## 2018-01-26 DIAGNOSIS — E039 Hypothyroidism, unspecified: Secondary | ICD-10-CM

## 2018-01-26 DIAGNOSIS — E782 Mixed hyperlipidemia: Secondary | ICD-10-CM | POA: Diagnosis not present

## 2018-01-26 DIAGNOSIS — Z0000 Encounter for general adult medical examination without abnormal findings: Secondary | ICD-10-CM

## 2018-01-26 DIAGNOSIS — M546 Pain in thoracic spine: Secondary | ICD-10-CM

## 2018-01-26 DIAGNOSIS — R7301 Impaired fasting glucose: Secondary | ICD-10-CM

## 2018-01-26 DIAGNOSIS — H6121 Impacted cerumen, right ear: Secondary | ICD-10-CM | POA: Diagnosis not present

## 2018-01-26 MED ORDER — CARBAMIDE PEROXIDE 6.5 % OT SOLN: 10.0000 [drp] | Freq: Two times a day (BID) | OTIC | 1 refills | Status: DC

## 2018-01-26 NOTE — Patient Instructions (Signed)

## 2018-01-26 NOTE — Progress Notes (Signed)
Subjective:   Alexandria Pittman is a 82 y.o. female who presents for Medicare Annual (Subsequent) preventive examination.  Review of Systems:  She is having trouble hearing out of right ear. Concerned it is impacted again.        Objective:     Vitals: BP (!) 142/61   Pulse 98   Ht 5\' 5"  (1.651 m)   Wt 165 lb (74.8 kg)   BMI 27.46 kg/m   Body mass index is 27.46 kg/m.  No flowsheet data found.  Tobacco Social History   Tobacco Use  Smoking Status Current Every Day Smoker  Smokeless Tobacco Never Used     Ready to quit: Not Answered Counseling given: Not Answered   Clinical Intake:   Heart: RRR no murmurs.  Lungs:no wheezing, rhonchi, crackles.                     Past Medical History:  Diagnosis Date  . Breast cancer, female ([place]) 05/13/2015  . Pernicious anemia 05/13/2015   Past Surgical History:  Procedure Laterality Date  . MASTECTOMY Left     Social History   Socioeconomic History  . Marital status: Unknown    Spouse name: Not on file  . Number of children: Not on file  . Years of education: Not on file  . Highest education level: Not on file  Occupational History  . Not on file  Social Needs  . Financial resource strain: Not on file  . Food insecurity:    Worry: Not on file    Inability: Not on file  . Transportation needs:    Medical: Not on file    Non-medical: Not on file  Tobacco Use  . Smoking status: Current Every Day Smoker  . Smokeless tobacco: Never Used  Substance and Sexual Activity  . Alcohol use: Not on file  . Drug use: Not on file  . Sexual activity: Not on file  Lifestyle  . Physical activity:    Days per week: Not on file    Minutes per session: Not on file  . Stress: Not on file  Relationships  . Social connections:    Talks on phone: Not on file    Gets together: Not on file    Attends religious service: Not on file    Active member of club or organization: Not on file    Attends meetings of clubs or  organizations: Not on file    Relationship status: Not on file  Other Topics Concern  . Not on file  Social History Narrative  . Not on file    Outpatient Encounter Medications as of 01/26/2018  Medication Sig  . amLODipine (NORVASC) 10 MG tablet TAKE 1 TABLET BY MOUTH DAILY, REPLACES 5MG  DAILY  . cholecalciferol (VITAMIN D) 1000 units tablet Take 2,000 Units by mouth daily.   . hydrochlorothiazide (HYDRODIURIL) 25 MG tablet TAKE 1 TABLET EVERY DAY  . [DISCONTINUED] levothyroxine (SYNTHROID, LEVOTHROID) 100 MCG tablet TAKE 1 TABLET (100 MCG TOTAL) BY MOUTH DAILY.  . [DISCONTINUED] levothyroxine (SYNTHROID, LEVOTHROID) 100 MCG tablet Take by mouth.  . carbamide peroxide (DEBROX) 6.5 % OTIC solution Place 10 drops into the right ear 2 (two) times daily. For 5 days or as needed. Dispense 1 bottle  . Cholecalciferol (D3 ADULT) 1000 units CHEW Chew 2,000 mg by mouth daily.  Marland Kitchen latanoprost (XALATAN) 0.005 % ophthalmic solution INSTILL 1 DROP INTO BOTH EYES EVERY DAY AT NIGHT  . [DISCONTINUED] triamcinolone cream (KENALOG)  0.1 % APPLY 1 APPLICATION TOPICALLY 2 (TWO) TIMES DAILY.   No facility-administered encounter medications on file as of 01/26/2018.     Activities of Daily Living In your present state of health, do you have any difficulty performing the following activities: 01/30/2018  Hearing? Y  Comment history of cerumen impaction  Vision? N  Difficulty concentrating or making decisions? N  Walking or climbing stairs? N  Dressing or bathing? N  Doing errands, shopping? N  Some recent data might be hidden    Patient Care Team: Lavada Mesi as PCP - General (Family Medicine)    Assessment:   This is a routine wellness examination for Alexandria Pittman.  Exercise Activities and Dietary recommendations    Goals    None      Fall Risk Fall Risk  01/30/2018 01/26/2018  Falls in the past year? No No   Is the patient's home free of loose throw rugs in walkways, pet beds, electrical  cords, etc?   yes      Grab bars in the bathroom? yes      Handrails on the stairs?   no      Adequate lighting?   yes  Timed Get Up and Go performed:   Depression Screen PHQ 2/9 Scores 01/30/2018 01/26/2018  PHQ - 2 Score 0 1     Cognitive Function     6CIT Screen 01/26/2018  What Year? 0 points  What month? 0 points  What time? 0 points  Count back from 20 0 points  Months in reverse 0 points  Repeat phrase 8 points  Total Score 8    Immunization History  Administered Date(s) Administered  . Pneumococcal Polysaccharide-23 06/21/2013    Qualifies for Shingles Vaccine  Yes encouraged shingrix.   Screening Tests Health Maintenance  Topic Date Due  . DEXA SCAN  09/30/2003  . PNA vac Low Risk Adult (2 of 2 - PCV13) 06/21/2014  . TETANUS/TDAP  08/07/2026 (Originally 09/29/1957)  . INFLUENZA VACCINE  05/12/2018    Cancer Screenings: Lung: Low Dose CT Chest recommended if Age 28-80 years, 30 pack-year currently smoking OR have quit w/in 15years. Patient does qualify. Breast:  Up to date on Mammogram? Yes   Up to date of Bone Density/Dexa? No Colorectal: declined todayl  Additional Screenings:  Hepatitis C Screening: done.      Plan:     I have personally reviewed and noted the following in the patient's chart:   . Medical and social history . Use of alcohol, tobacco or illicit drugs  . Current medications and supplements . Functional ability and status . Nutritional status . Physical activity . Advanced directives . List of other physicians . Hospitalizations, surgeries, and ER visits in previous 12 months . Vitals . Screenings to include cognitive, depression, and falls . Referrals and appointments  .Indication: Cerumen impaction of the ear(s)  Medical necessity statement: On physical examination, cerumen impairs clinically significant portions of the external auditory canal, and tympanic membrane. Noted obstructive, copious cerumen that cannot be  removed without magnification and instrumentations requiring physician skills Consent: Discussed benefits and risks of procedure and verbal consent obtained Procedure: Patient was prepped for the procedure. Utilized an otoscope to assess and take note of the ear canal, the tympanic membrane, and the presence, amount, and placement of the cerumen. Gentle water irrigation and soft plastic curette was utilized to remove cerumen.  Post procedure examination: shows cerumen was completely removed. Patient tolerated procedure well. The patient is  made aware that they may experience temporary vertigo, temporary hearing loss, and temporary discomfort. If these symptom last for more than 24 hours to call the clinic or proceed to the ED.  Not all of the wax of right ear removed after 3 bottles of wash and self curetting. Sent debrox to use for 1 to 2 weeks then follow up for reirrigation.   Encouraged shingrix vaccine.   Fasting labs ordered.   At this point still not released from oncology for CT lung screening low dose.   In addition, I have reviewed and discussed with patient certain preventive protocols, quality metrics, and best practice recommendations. A written personalized care plan for preventive services as well as general preventive health recommendations were provided to patient.     Iran Planas, PA-C

## 2018-01-27 ENCOUNTER — Other Ambulatory Visit: Payer: Self-pay | Admitting: Physician Assistant

## 2018-01-27 DIAGNOSIS — E039 Hypothyroidism, unspecified: Secondary | ICD-10-CM

## 2018-01-27 DIAGNOSIS — E782 Mixed hyperlipidemia: Secondary | ICD-10-CM

## 2018-01-27 LAB — HEMOGLOBIN A1C
Hgb A1c MFr Bld: 5.5 % of total Hgb (ref <5.7)
Mean Plasma Glucose: 111 (calc)
eAG (mmol/L): 6.2 (calc)

## 2018-01-27 LAB — LIPID PANEL W/REFLEX DIRECT LDL
CHOLESTEROL: 187 mg/dL (ref <200)
HDL: 47 mg/dL — AB (ref >50)
LDL CHOLESTEROL (CALC): 123 mg/dL — AB
Non-HDL Cholesterol (Calc): 140 mg/dL (calc) — ABNORMAL HIGH (ref <130)
TRIGLYCERIDES: 73 mg/dL (ref <150)
Total CHOL/HDL Ratio: 4 (calc) (ref <5.0)

## 2018-01-27 LAB — TSH: TSH: 2 mIU/L (ref 0.40–4.50)

## 2018-01-27 MED ORDER — LEVOTHYROXINE SODIUM 100 MCG PO TABS: ORAL_TABLET | ORAL | 4 refills | Status: DC

## 2018-01-27 NOTE — Progress Notes (Signed)
Call pt: A!C is normal range.  Thyroid is good.   Your cholesterol by numbers is NOT terrible; however, due to risk factors such as hypertension, age, smoking your overall CV is 40.8 percent of something happening in next 10 years. Anything over 7.5 percent we encourage medications. I believe you need to be on a statin to help lower this risk. Are you ok with this?

## 2018-01-30 ENCOUNTER — Encounter: Payer: Self-pay | Admitting: Physician Assistant

## 2018-01-30 ENCOUNTER — Telehealth: Payer: Self-pay | Admitting: Physician Assistant

## 2018-01-30 DIAGNOSIS — H6121 Impacted cerumen, right ear: Secondary | ICD-10-CM | POA: Insufficient documentation

## 2018-01-30 NOTE — Telephone Encounter (Signed)
Did we not give prevnar 13 when patient was hear for medicare wellness?

## 2018-04-13 ENCOUNTER — Other Ambulatory Visit: Payer: Self-pay | Admitting: Physician Assistant

## 2018-04-13 DIAGNOSIS — I10 Essential (primary) hypertension: Secondary | ICD-10-CM

## 2018-09-26 NOTE — Telephone Encounter (Signed)
I'm pretty sure I gave it at her appointment.

## 2018-10-27 ENCOUNTER — Other Ambulatory Visit: Payer: Self-pay | Admitting: Physician Assistant

## 2018-10-27 DIAGNOSIS — I10 Essential (primary) hypertension: Secondary | ICD-10-CM

## 2019-03-25 ENCOUNTER — Other Ambulatory Visit: Payer: Self-pay | Admitting: Physician Assistant

## 2019-03-25 DIAGNOSIS — E039 Hypothyroidism, unspecified: Secondary | ICD-10-CM

## 2019-03-30 ENCOUNTER — Encounter: Payer: Self-pay | Admitting: Physician Assistant

## 2019-03-30 ENCOUNTER — Ambulatory Visit (INDEPENDENT_AMBULATORY_CARE_PROVIDER_SITE_OTHER): Payer: Medicare Other | Admitting: Physician Assistant

## 2019-03-30 VITALS — BP 141/72 | HR 104 | Temp 98.4°F | Ht 64.0 in | Wt 166.0 lb

## 2019-03-30 DIAGNOSIS — D510 Vitamin B12 deficiency anemia due to intrinsic factor deficiency: Secondary | ICD-10-CM

## 2019-03-30 DIAGNOSIS — J449 Chronic obstructive pulmonary disease, unspecified: Secondary | ICD-10-CM

## 2019-03-30 DIAGNOSIS — E039 Hypothyroidism, unspecified: Secondary | ICD-10-CM

## 2019-03-30 DIAGNOSIS — E782 Mixed hyperlipidemia: Secondary | ICD-10-CM

## 2019-03-30 DIAGNOSIS — Z1239 Encounter for other screening for malignant neoplasm of breast: Secondary | ICD-10-CM

## 2019-03-30 DIAGNOSIS — F17200 Nicotine dependence, unspecified, uncomplicated: Secondary | ICD-10-CM

## 2019-03-30 DIAGNOSIS — R221 Localized swelling, mass and lump, neck: Secondary | ICD-10-CM

## 2019-03-30 DIAGNOSIS — Z853 Personal history of malignant neoplasm of breast: Secondary | ICD-10-CM | POA: Diagnosis not present

## 2019-03-30 DIAGNOSIS — Z5181 Encounter for therapeutic drug level monitoring: Secondary | ICD-10-CM

## 2019-03-30 DIAGNOSIS — I10 Essential (primary) hypertension: Secondary | ICD-10-CM

## 2019-03-30 MED ORDER — AMLODIPINE BESYLATE 10 MG PO TABS: ORAL_TABLET | ORAL | 1 refills | Status: DC

## 2019-03-30 MED ORDER — ATORVASTATIN CALCIUM 40 MG PO TABS: 40.0000 mg | ORAL_TABLET | Freq: Every day | ORAL | 3 refills | Status: DC

## 2019-03-30 NOTE — Progress Notes (Signed)
Subjective:    Patient ID: Alexandria Pittman, female    DOB: 13-Oct-1935, 83 y.o.   MRN: [id number]  HPI  Pt is a 83 yo female with COPD, HLD, hypothyroidism, HTN, pernicious anemia who presents to the clinic for 1 year follow up.   COPD- continues to smoke. Denies any excessive SOB, chest tightness. She does have chronic cough.   She does feel weak and tired at times. She would like her labs checked. She has some short term memory loss. Mostly remembering things told that day.hx of issues with b12. She gets shot at oncology every 3 months.    Concerned about not getting her mammogram. Hx of breast cancer. Per patient [facility] not open.   She does mention right sided lymph node enlargement that comes and goes. No pain or tenderness. No ST or neck pain. No problems swallowing. Zyrtec seems to make better.   .. Active Ambulatory Problems    Diagnosis Date Noted  . Essential hypertension, benign 05/13/2015  . Hypothyroidism 05/13/2015  . Pernicious anemia 05/13/2015  . Breast cancer, female ([place]) 05/13/2015  . Renal insufficiency 05/13/2015  . COPD, mild ([place]) 05/23/2015  . Metatarsalgia of both feet 05/31/2015  . Altered mental status 05/31/2015  . Mass of right side of neck 08/16/2015  . Hyperlipidemia 08/23/2015  . Degenerative disc disease, lumbar 08/07/2016  . Primary insomnia 08/07/2016  . Bilateral impacted cerumen 08/09/2016  . Pulmonary emphysema ([place]) 11/20/2016  . Bilateral hearing loss due to cerumen impaction 11/20/2016  . Chronic bilateral thoracic back pain 01/26/2018  . Hearing loss of right ear due to cerumen impaction 01/30/2018  . Current smoker 04/03/2019   Resolved Ambulatory Problems    Diagnosis Date Noted  . No Resolved Ambulatory Problems   No Additional Past Medical History        Review of Systems  All other systems reviewed and are negative.      Objective:   Physical Exam Vitals signs reviewed.  Constitutional:      Appearance:  Normal appearance.  HENT:     Head: Normocephalic and atraumatic.     Right Ear: Tympanic membrane and ear canal normal.     Left Ear: Tympanic membrane and ear canal normal.     Nose: Nose normal.     Mouth/Throat:     Comments: Right anterior cervical lymphnode enlargement. Mobile and non tender. Slightly bigger than left.  Eyes:     Pupils: Pupils are equal, round, and reactive to light.  Cardiovascular:     Rate and Rhythm: Regular rhythm. Tachycardia present.     Pulses: Normal pulses.  Pulmonary:     Effort: Pulmonary effort is normal.     Breath sounds: Normal breath sounds.  Abdominal:     General: Abdomen is flat.     Palpations: Abdomen is soft.  Neurological:     General: No focal deficit present.     Mental Status: She is alert and oriented to person, place, and time.  Psychiatric:        Mood and Affect: Mood normal.           Assessment & Plan:  Marland KitchenMarland KitchenMae was seen today for annual exam.  Diagnoses and all orders for this visit:  COPD, mild ([place])  History of breast cancer -     MM 3D SCREEN BREAST BILATERAL  Screening breast examination -     MM 3D SCREEN BREAST BILATERAL  Hypothyroidism, unspecified type -  TSH -     levothyroxine (SYNTHROID) 100 MCG tablet; TAKE 1 TABLET BY MOUTH EVERY DAY  Mixed hyperlipidemia -     Lipid panel -     atorvastatin (LIPITOR) 40 MG tablet; Take 1 tablet (40 mg total) by mouth daily.  Medication monitoring encounter -     MM 3D SCREEN BREAST BILATERAL -     TSH -     COMPLETE METABOLIC PANEL WITH GFR -     VITAMIN D 25 Hydroxy (Vit-D Deficiency, Fractures) -     Lipid panel  Essential hypertension, benign -     amLODipine (NORVASC) 10 MG tablet; TAKE 1 TABLET BY MOUTH DAILY.  Pernicious anemia -     B12 and Folate Panel  Current smoker  Mass of right side of neck   Fasting labs ordered.  HIGH CV risk based on BP, age, smoking hx, cholesterol. Will start lipitor for risk reduction.  Pt declined smoking  cessation.   Ordered mammogram. [facility] to confirm [facility] is closed.   Refilled BP medications. At goal for her age.   Suspect right lymphnode enlargement. Slightly bigger than left. Seems to go down with zyrtec. Continue on zyrtec. If gets larger again can ultrasound.

## 2019-04-03 ENCOUNTER — Encounter: Payer: Self-pay | Admitting: Physician Assistant

## 2019-04-03 DIAGNOSIS — F17200 Nicotine dependence, unspecified, uncomplicated: Secondary | ICD-10-CM | POA: Insufficient documentation

## 2019-04-03 MED ORDER — LEVOTHYROXINE SODIUM 100 MCG PO TABS: ORAL_TABLET | ORAL | 1 refills | Status: DC

## 2019-04-03 NOTE — Progress Notes (Signed)
Not sure why other labs have not resulted.   Vitamin D is low. Confirm she is taking her 2000 units daily with protein/fat food not on an empty stomach. Encourage 15 minutes of sunlight.  b12 is perfect!

## 2019-04-05 NOTE — Progress Notes (Signed)
They show ordered so not sure why not drawn.

## 2019-04-05 NOTE — Progress Notes (Deleted)
Subjective:   Alexandria Pittman is a 83 y.o. female who presents for Medicare Annual (Subsequent) preventive examination.  Review of Systems:  No ROS.  Medicare Wellness Virtual Visit.  Visual/audio telehealth visit, UTA vital signs.   See social history for additional risk factors.      Sleep patterns:    Home Safety/Smoke Alarms: Feels safe in home. Smoke alarms in place.  Living environment;  Seat Belt Safety/Bike Helmet: Wears seat belt.   Female:   Pap-  Aged out     Mammo-  Aged out     Dexa scan-        CCS-Aged out     Objective:     Vitals: There were no vitals taken for this visit.  There is no height or weight on file to calculate BMI.  No flowsheet data found.  Tobacco Social History   Tobacco Use  Smoking Status Current Every Day Smoker  Smokeless Tobacco Never Used     Ready to quit: Not Answered Counseling given: Not Answered   Clinical Intake:                       Past Medical History:  Diagnosis Date  . Breast cancer, female ([place]) 05/13/2015  . Pernicious anemia 05/13/2015   Past Surgical History:  Procedure Laterality Date  . MASTECTOMY Left    No family history on file. Social History   Socioeconomic History  . Marital status: Unknown    Spouse name: Not on file  . Number of children: Not on file  . Years of education: Not on file  . Highest education level: Not on file  Occupational History  . Not on file  Social Needs  . Financial resource strain: Not on file  . Food insecurity    Worry: Not on file    Inability: Not on file  . Transportation needs    Medical: Not on file    Non-medical: Not on file  Tobacco Use  . Smoking status: Current Every Day Smoker  . Smokeless tobacco: Never Used  Substance and Sexual Activity  . Alcohol use: Not on file  . Drug use: Not on file  . Sexual activity: Not on file  Lifestyle  . Physical activity    Days per week: Not on file    Minutes per session: Not on file  .  Stress: Not on file  Relationships  . Social Herbalist on phone: Not on file    Gets together: Not on file    Attends religious service: Not on file    Active member of club or organization: Not on file    Attends meetings of clubs or organizations: Not on file    Relationship status: Not on file  Other Topics Concern  . Not on file  Social History Narrative  . Not on file    Outpatient Encounter Medications as of 04/10/2019  Medication Sig  . amLODipine (NORVASC) 10 MG tablet TAKE 1 TABLET BY MOUTH DAILY.  Marland Kitchen atorvastatin (LIPITOR) 40 MG tablet Take 1 tablet (40 mg total) by mouth daily.  . carbamide peroxide (DEBROX) 6.5 % OTIC solution Place 10 drops into the right ear 2 (two) times daily. For 5 days or as needed. Dispense 1 bottle  . cholecalciferol (VITAMIN D) 1000 units tablet Take 2,000 Units by mouth daily.   Marland Kitchen latanoprost (XALATAN) 0.005 % ophthalmic solution INSTILL 1 DROP INTO BOTH EYES EVERY DAY  AT NIGHT  . levothyroxine (SYNTHROID) 100 MCG tablet TAKE 1 TABLET BY MOUTH EVERY DAY   No facility-administered encounter medications on file as of 04/10/2019.     Activities of Daily Living No flowsheet data found.  Patient Care Team: Lavada Mesi as PCP - General (Family Medicine)    Assessment:   This is a routine wellness examination for Alexandria Pittman.Physical assessment deferred to PCP.   Exercise Activities and Dietary recommendations   Diet  Breakfast: Lunch:  Dinner:       Goals   None     Fall Risk Fall Risk  01/30/2018 01/26/2018  Falls in the past year? No No   Is the patient's home free of loose throw rugs in walkways, pet beds, electrical cords, etc?   {Blank single:19197::"yes","no"}      Grab bars in the bathroom? {Blank single:19197::"yes","no"}      Handrails on the stairs?   {Blank single:19197::"yes","no"}      Adequate lighting?   {Blank single:19197::"yes","no"}  Depression Screen PHQ 2/9 Scores 03/30/2019 01/30/2018 01/26/2018   PHQ - 2 Score 1 0 1  PHQ- 9 Score 4 - -     Cognitive Function     6CIT Screen 01/26/2018  What Year? 0 points  What month? 0 points  What time? 0 points  Count back from 20 0 points  Months in reverse 0 points  Repeat phrase 8 points  Total Score 8    Immunization History  Administered Date(s) Administered  . Pneumococcal Polysaccharide-23 06/21/2013    Screening Tests Health Maintenance  Topic Date Due  . DEXA SCAN  03/29/2020 (Originally 09/30/2003)  . TETANUS/TDAP  08/07/2026 (Originally 09/29/1957)  . INFLUENZA VACCINE  05/13/2019  . PNA vac Low Risk Adult  Discontinued        Plan:   ***   I have personally reviewed and noted the following in the patient's chart:   . Medical and social history . Use of alcohol, tobacco or illicit drugs  . Current medications and supplements . Functional ability and status . Nutritional status . Physical activity . Advanced directives . List of other physicians . Hospitalizations, surgeries, and ER visits in previous 12 months . Vitals . Screenings to include cognitive, depression, and falls . Referrals and appointments  In addition, I have reviewed and discussed with patient certain preventive protocols, quality metrics, and best practice recommendations. A written personalized care plan for preventive services as well as general preventive health recommendations were provided to patient.     Alexandria Chars, LPN  2/29/7989

## 2019-04-06 LAB — HEPATIC FUNCTION PANEL
AG Ratio: 1.3 (calc) (ref 1.0–2.5)
ALT: 10 U/L (ref 6–29)
AST: 19 U/L (ref 10–35)
Albumin: 4.4 g/dL (ref 3.6–5.1)
Alkaline phosphatase (APISO): 97 U/L (ref 37–153)
Bilirubin, Direct: 0.1 mg/dL (ref 0.0–0.2)
Globulin: 3.3 g/dL (calc) (ref 1.9–3.7)
Indirect Bilirubin: 0.3 mg/dL (calc) (ref 0.2–1.2)
Total Bilirubin: 0.4 mg/dL (ref 0.2–1.2)
Total Protein: 7.7 g/dL (ref 6.1–8.1)

## 2019-04-06 LAB — BASIC METABOLIC PANEL WITH GFR
BUN/Creatinine Ratio: 19 (calc) (ref 6–22)
BUN: 23 mg/dL (ref 7–25)
CO2: 19 mmol/L — ABNORMAL LOW (ref 20–32)
Calcium: 9.9 mg/dL (ref 8.6–10.4)
Chloride: 109 mmol/L (ref 98–110)
Creat: 1.23 mg/dL — ABNORMAL HIGH (ref 0.60–0.88)
GFR, Est African American: 48 mL/min/{1.73_m2} — ABNORMAL LOW (ref >=60)
GFR, Est Non African American: 41 mL/min/{1.73_m2} — ABNORMAL LOW (ref >=60)
Glucose, Bld: 104 mg/dL — ABNORMAL HIGH (ref 65–99)
Potassium: 4.3 mmol/L (ref 3.5–5.3)
Sodium: 144 mmol/L (ref 135–146)

## 2019-04-06 LAB — VITAMIN D 25 HYDROXY (VIT D DEFICIENCY, FRACTURES): Vit D, 25-Hydroxy: 25 ng/mL — ABNORMAL LOW (ref 30–100)

## 2019-04-06 LAB — LIPID PANEL
Cholesterol: 210 mg/dL — ABNORMAL HIGH (ref <200)
HDL: 52 mg/dL (ref >=50)
LDL Cholesterol (Calc): 138 mg/dL (calc) — ABNORMAL HIGH
Non-HDL Cholesterol (Calc): 158 mg/dL (calc) — ABNORMAL HIGH (ref <130)
Total CHOL/HDL Ratio: 4 (calc) (ref <5.0)
Triglycerides: 94 mg/dL (ref <150)

## 2019-04-06 LAB — B12 AND FOLATE PANEL
Folate: 13.1 ng/mL
Vitamin B-12: 994 pg/mL (ref 200–1100)

## 2019-04-06 LAB — TSH: TSH: 4.22 mIU/L (ref 0.40–4.50)

## 2019-04-07 NOTE — Progress Notes (Signed)
Call pt: kidney function up a little bit. Nothing drastic. No anti-inflammatories and stay hydrated.   Your LDL has worsened. Due to blood pressure, smoking, age your CV risk is elevated. Are you taking Lipitor?

## 2019-04-10 ENCOUNTER — Ambulatory Visit: Payer: Medicare Other

## 2019-06-01 ENCOUNTER — Ambulatory Visit (INDEPENDENT_AMBULATORY_CARE_PROVIDER_SITE_OTHER): Payer: Medicare Other

## 2019-06-01 ENCOUNTER — Other Ambulatory Visit: Payer: Self-pay

## 2019-06-01 DIAGNOSIS — Z5181 Encounter for therapeutic drug level monitoring: Secondary | ICD-10-CM

## 2019-06-01 DIAGNOSIS — Z1239 Encounter for other screening for malignant neoplasm of breast: Secondary | ICD-10-CM

## 2019-06-01 DIAGNOSIS — Z853 Personal history of malignant neoplasm of breast: Secondary | ICD-10-CM

## 2019-06-05 NOTE — Progress Notes (Signed)
Normal mammogram

## 2019-09-29 ENCOUNTER — Ambulatory Visit: Payer: Medicare Other | Admitting: Physician Assistant

## 2019-10-02 ENCOUNTER — Ambulatory Visit: Payer: Medicare Other | Admitting: Physician Assistant

## 2019-10-10 ENCOUNTER — Ambulatory Visit: Payer: Medicare Other | Admitting: Physician Assistant

## 2019-10-17 ENCOUNTER — Other Ambulatory Visit: Payer: Self-pay

## 2019-10-17 ENCOUNTER — Ambulatory Visit (INDEPENDENT_AMBULATORY_CARE_PROVIDER_SITE_OTHER): Payer: Medicare Other | Admitting: Physician Assistant

## 2019-10-17 ENCOUNTER — Encounter: Payer: Self-pay | Admitting: Physician Assistant

## 2019-10-17 VITALS — BP 141/82 | HR 112 | Ht 64.0 in | Wt 176.0 lb

## 2019-10-17 DIAGNOSIS — E039 Hypothyroidism, unspecified: Secondary | ICD-10-CM | POA: Diagnosis not present

## 2019-10-17 DIAGNOSIS — I10 Essential (primary) hypertension: Secondary | ICD-10-CM | POA: Diagnosis not present

## 2019-10-17 DIAGNOSIS — K921 Melena: Secondary | ICD-10-CM | POA: Diagnosis not present

## 2019-10-17 DIAGNOSIS — R531 Weakness: Secondary | ICD-10-CM | POA: Insufficient documentation

## 2019-10-17 DIAGNOSIS — J439 Emphysema, unspecified: Secondary | ICD-10-CM | POA: Diagnosis not present

## 2019-10-17 DIAGNOSIS — G4733 Obstructive sleep apnea (adult) (pediatric): Secondary | ICD-10-CM | POA: Insufficient documentation

## 2019-10-17 DIAGNOSIS — F17200 Nicotine dependence, unspecified, uncomplicated: Secondary | ICD-10-CM

## 2019-10-17 DIAGNOSIS — R0602 Shortness of breath: Secondary | ICD-10-CM | POA: Insufficient documentation

## 2019-10-17 DIAGNOSIS — R5383 Other fatigue: Secondary | ICD-10-CM

## 2019-10-17 DIAGNOSIS — N3281 Overactive bladder: Secondary | ICD-10-CM | POA: Insufficient documentation

## 2019-10-17 MED ORDER — UMECLIDINIUM-VILANTEROL 62.5-25 MCG/INH IN AEPB: 1.0000 | INHALATION_SPRAY | Freq: Every day | RESPIRATORY_TRACT | 2 refills | Status: DC

## 2019-10-17 NOTE — Progress Notes (Addendum)
Acute Office Visit  Subjective:    Patient ID: Alexandria Pittman, female    DOB: 01-18-1936, 84 y.o.   MRN: [id number]  Chief Complaint  Patient presents with  . COPD    HPI Patient is in today for 6 mo f/u visit. Pt has history of breast cancer, emphysema, hypothyroidism, hypertension, and pernicious anemia.Pt is a current smoker of 5 cigarettes to 1 pack per day. Pt complains of weakness, mood changes, irritability, fatigue, and being overall "grumpy" for the past few months. Pt associates these symptoms with quarantine. Pt complains of waking 4-5x per night to urinate, but denies hematuria or dysuria. Pt complains of hematochezia x30yr, she mostly notices the blood in the stool when she wipes describing it as "alternating b/w brown and red". Yesterday, 10/15/2018 she coughed and reported blood spraying from her rectum. Per the patients daughter she is non compliant with medications.  Past Medical History:  Diagnosis Date  . Breast cancer, female ([place]) 05/13/2015  . Pernicious anemia 05/13/2015  Pt has previous diagnosis of OSA but denies use of CPAP machine and is not interested in starting use with the machine.  Past Surgical History:  Procedure Laterality Date  . MASTECTOMY Left     No family history on file.  Social History   Socioeconomic History  . Marital status: Unknown    Spouse name: Not on file  . Number of children: Not on file  . Years of education: Not on file  . Highest education level: Not on file  Occupational History  . Not on file  Tobacco Use  . Smoking status: Current Every Day Smoker  . Smokeless tobacco: Never Used  Substance and Sexual Activity  . Alcohol use: Not on file  . Drug use: Not on file  . Sexual activity: Not on file  Other Topics Concern  . Not on file  Social History Narrative  . Not on file   Social Determinants of Health   Financial Resource Strain:   . Difficulty of Paying Living Expenses: Not on file  Food Insecurity:   .  Worried About Charity fundraiser in the Last Year: Not on file  . Ran Out of Food in the Last Year: Not on file  Transportation Needs:   . Lack of Transportation (Medical): Not on file  . Lack of Transportation (Non-Medical): Not on file  Physical Activity:   . Days of Exercise per Week: Not on file  . Minutes of Exercise per Session: Not on file  Stress:   . Feeling of Stress : Not on file  Social Connections:   . Frequency of Communication with Friends and Family: Not on file  . Frequency of Social Gatherings with Friends and Family: Not on file  . Attends Religious Services: Not on file  . Active Member of Clubs or Organizations: Not on file  . Attends Archivist Meetings: Not on file  . Marital Status: Not on file  Intimate Partner Violence:   . Fear of Current or Ex-Partner: Not on file  . Emotionally Abused: Not on file  . Physically Abused: Not on file  . Sexually Abused: Not on file    Outpatient Medications Prior to Visit  Medication Sig Dispense Refill  . amLODipine (NORVASC) 10 MG tablet TAKE 1 TABLET BY MOUTH DAILY. 90 tablet 1  . atorvastatin (LIPITOR) 40 MG tablet Take 1 tablet (40 mg total) by mouth daily. 90 tablet 3  . cholecalciferol (VITAMIN D) 1000 units  tablet Take 2,000 Units by mouth daily.     Marland Kitchen latanoprost (XALATAN) 0.005 % ophthalmic solution INSTILL 1 DROP INTO BOTH EYES EVERY DAY AT NIGHT  11  . levothyroxine (SYNTHROID) 100 MCG tablet TAKE 1 TABLET BY MOUTH EVERY DAY 90 tablet 1  . carbamide peroxide (DEBROX) 6.5 % OTIC solution Place 10 drops into the right ear 2 (two) times daily. For 5 days or as needed. Dispense 1 bottle 1 mL 1   No facility-administered medications prior to visit.    No Known Allergies  Review of Systems See HPI.     Objective:    Physical Exam  BP (!) 141/82   Pulse (!) 112   Ht 5\' 4"  (1.626 m)   Wt 176 lb (79.8 kg)   SpO2 100%   BMI 30.21 kg/m  Wt Readings from Last 3 Encounters:  10/17/19 176 lb  (79.8 kg)  03/30/19 166 lb (75.3 kg)  01/26/18 165 lb (74.8 kg)   Upon physical exam there was cervical lymphadenopathy present. Heart RRR, coarse breath sounds bilaterally. Abdomen soft and nontender in all 4 quadrants.  There are no preventive care reminders to display for this patient.  .. Depression screen [facility] 2/9 03/30/2019 01/30/2018 01/26/2018  Decreased Interest 1 0 1  Down, Depressed, Hopeless 0 0 0  PHQ - 2 Score 1 0 1  Altered sleeping 0 - -  Tired, decreased energy 3 - -  Change in appetite 0 - -  Feeling bad or failure about yourself  0 - -  Trouble concentrating 0 - -  Moving slowly or fidgety/restless 0 - -  Suicidal thoughts 0 - -  PHQ-9 Score 4 - -  Difficult doing work/chores Not difficult at all - -   .Marland Kitchen GAD 7 : Generalized Anxiety Score 03/30/2019  Nervous, Anxious, on Edge 0  Control/stop worrying 0  Worry too much - different things 0  Trouble relaxing 0  Restless 0  Easily annoyed or irritable 0  Afraid - awful might happen 0  Total GAD 7 Score 0  Anxiety Difficulty Not difficult at all        Assessment & Plan:   Marland KitchenMarland KitchenMae was seen today for copd.  Diagnoses and all orders for this visit:  Blood in stool -     Ambulatory referral to Gastroenterology  Pulmonary emphysema, unspecified emphysema type ([place]) -     CXR 2 view -     umeclidinium-vilanterol (ANORO ELLIPTA) 62.5-25 MCG/INH AEPB; Inhale 1 puff into the lungs daily.  Essential hypertension, benign -     COMPLETE METABOLIC PANEL WITH GFR  Acquired hypothyroidism -     TSH -     COMPLETE METABOLIC PANEL WITH GFR -     CBC w/Diff  SOB (shortness of breath) -     CXR 2 view  Fatigue, unspecified type  OAB (overactive bladder)  OSA (obstructive sleep apnea)    Pt has declined most of her preventative care for the last few years. She continues to smoke every day. She is not compliant with medications. She comes in today just feeling "bad" and "tired" and now reports some blood in  stool for 1 year.   With hx of BC pt needs diagnostic colonoscopy. Will send to GI. Will get labs today to check hgb, liver, and kidneys.   She is SOB likely due to untreated emphysema. Discussed daily inhaler and using CPAP. She reports " I will not use that thing on my face". She  agrees to start anoro once a day. Will get CXR due to worsening SOB. Reassuring pulse ox was 100 percent today.   Sounds like she has some OAB symptoms she declines any medication treatment.   Discussed with patient she has many reasons for fatigue but she has to be willing to get a diagnoses and then try to treat it to feel better.  Continue follow up with Dr. Georgiann Cocker for b12 shots and BC follow up.   Marland KitchenMarland KitchenSpent 40 minutes with patient and greater than 50 percent of visit spent counseling patient regarding treatment plan.  Marland KitchenVernetta Honey PA-C, have reviewed and agree with the above documentation in it's entirety.

## 2019-10-17 NOTE — Progress Notes (Deleted)
   Subjective:    Patient ID: Alexandria Pittman, female    DOB: 1936-06-03, 84 y.o.   MRN: [id number]  HPI Hx of breast cancer Will not use machine OSA   Will refer to GI.   Review of Systems     Objective:   Physical Exam        Assessment & Plan:    CxR

## 2019-10-18 ENCOUNTER — Encounter: Payer: Self-pay | Admitting: Physician Assistant

## 2019-10-18 ENCOUNTER — Other Ambulatory Visit: Payer: Self-pay | Admitting: Neurology

## 2019-10-18 DIAGNOSIS — E039 Hypothyroidism, unspecified: Secondary | ICD-10-CM

## 2019-10-18 LAB — COMPLETE METABOLIC PANEL WITH GFR
AG Ratio: 1.3 (calc) (ref 1.0–2.5)
ALT: 10 U/L (ref 6–29)
AST: 20 U/L (ref 10–35)
Albumin: 4.1 g/dL (ref 3.6–5.1)
Alkaline phosphatase (APISO): 95 U/L (ref 37–153)
BUN/Creatinine Ratio: 18 (calc) (ref 6–22)
BUN: 20 mg/dL (ref 7–25)
CO2: 27 mmol/L (ref 20–32)
Calcium: 9.1 mg/dL (ref 8.6–10.4)
Chloride: 105 mmol/L (ref 98–110)
Creat: 1.11 mg/dL — ABNORMAL HIGH (ref 0.60–0.88)
GFR, Est African American: 54 mL/min/{1.73_m2} — ABNORMAL LOW (ref >=60)
GFR, Est Non African American: 47 mL/min/{1.73_m2} — ABNORMAL LOW (ref >=60)
Globulin: 3.1 g/dL (calc) (ref 1.9–3.7)
Glucose, Bld: 102 mg/dL — ABNORMAL HIGH (ref 65–99)
Potassium: 4.3 mmol/L (ref 3.5–5.3)
Sodium: 139 mmol/L (ref 135–146)
Total Bilirubin: 0.4 mg/dL (ref 0.2–1.2)
Total Protein: 7.2 g/dL (ref 6.1–8.1)

## 2019-10-18 LAB — CBC WITH DIFFERENTIAL/PLATELET
Absolute Monocytes: 637 cells/uL (ref 200–950)
Basophils Absolute: 49 cells/uL (ref 0–200)
Basophils Relative: 0.7 %
Eosinophils Absolute: 210 cells/uL (ref 15–500)
Eosinophils Relative: 3 %
HCT: 31.1 % — ABNORMAL LOW (ref 35.0–45.0)
Hemoglobin: 9.6 g/dL — ABNORMAL LOW (ref 11.7–15.5)
Lymphs Abs: 1239 cells/uL (ref 850–3900)
MCH: 24.4 pg — ABNORMAL LOW (ref 27.0–33.0)
MCHC: 30.9 g/dL — ABNORMAL LOW (ref 32.0–36.0)
MCV: 79.1 fL — ABNORMAL LOW (ref 80.0–100.0)
MPV: 10.7 fL (ref 7.5–12.5)
Monocytes Relative: 9.1 %
Neutro Abs: 4865 cells/uL (ref 1500–7800)
Neutrophils Relative %: 69.5 %
Platelets: 316 10*3/uL (ref 140–400)
RBC: 3.93 10*6/uL (ref 3.80–5.10)
RDW: 14.1 % (ref 11.0–15.0)
Total Lymphocyte: 17.7 %
WBC: 7 10*3/uL (ref 3.8–10.8)

## 2019-10-18 LAB — TSH: TSH: 2.56 mIU/L (ref 0.40–4.50)

## 2019-10-18 MED ORDER — LEVOTHYROXINE SODIUM 100 MCG PO TABS: ORAL_TABLET | ORAL | 3 refills | Status: AC

## 2019-10-18 NOTE — Progress Notes (Signed)
Call pt:  Thyroid stable. Can you make sure she has 1 year of thyroid refills.  Kidney function is stable.  Your hemoglobin is low. When GI calls please go. I do think you are likely losing some blood from your GI tract.

## 2019-11-13 ENCOUNTER — Other Ambulatory Visit: Payer: Self-pay | Admitting: Physician Assistant

## 2019-11-13 DIAGNOSIS — E039 Hypothyroidism, unspecified: Secondary | ICD-10-CM

## 2019-12-25 ENCOUNTER — Ambulatory Visit (INDEPENDENT_AMBULATORY_CARE_PROVIDER_SITE_OTHER): Payer: Medicare Other | Admitting: Physician Assistant

## 2019-12-25 ENCOUNTER — Ambulatory Visit (INDEPENDENT_AMBULATORY_CARE_PROVIDER_SITE_OTHER): Payer: Medicare Other

## 2019-12-25 ENCOUNTER — Encounter: Payer: Self-pay | Admitting: Physician Assistant

## 2019-12-25 ENCOUNTER — Other Ambulatory Visit: Payer: Self-pay

## 2019-12-25 VITALS — BP 142/82 | HR 96 | Ht 64.0 in | Wt 181.0 lb

## 2019-12-25 DIAGNOSIS — R0602 Shortness of breath: Secondary | ICD-10-CM

## 2019-12-25 DIAGNOSIS — E039 Hypothyroidism, unspecified: Secondary | ICD-10-CM | POA: Diagnosis not present

## 2019-12-25 DIAGNOSIS — N1831 Chronic kidney disease, stage 3a: Secondary | ICD-10-CM

## 2019-12-25 DIAGNOSIS — D510 Vitamin B12 deficiency anemia due to intrinsic factor deficiency: Secondary | ICD-10-CM

## 2019-12-25 DIAGNOSIS — I10 Essential (primary) hypertension: Secondary | ICD-10-CM

## 2019-12-25 DIAGNOSIS — J439 Emphysema, unspecified: Secondary | ICD-10-CM

## 2019-12-25 DIAGNOSIS — K921 Melena: Secondary | ICD-10-CM | POA: Diagnosis not present

## 2019-12-25 DIAGNOSIS — R531 Weakness: Secondary | ICD-10-CM

## 2019-12-25 DIAGNOSIS — Z800 Family history of malignant neoplasm of digestive organs: Secondary | ICD-10-CM | POA: Insufficient documentation

## 2019-12-25 MED ORDER — ALBUTEROL SULFATE HFA 108 (90 BASE) MCG/ACT IN AERS
2.0000 | INHALATION_SPRAY | Freq: Once | RESPIRATORY_TRACT | Status: AC
  Administered 2019-12-25: 2 via RESPIRATORY_TRACT

## 2019-12-25 MED ORDER — UMECLIDINIUM-VILANTEROL 62.5-25 MCG/INH IN AEPB: 1.0000 | INHALATION_SPRAY | Freq: Every day | RESPIRATORY_TRACT | 11 refills | Status: DC

## 2019-12-25 MED ORDER — ALBUTEROL SULFATE (2.5 MG/3ML) 0.083% IN NEBU: 2.5000 mg | INHALATION_SOLUTION | Freq: Once | RESPIRATORY_TRACT | Status: DC

## 2019-12-25 MED ORDER — OMEPRAZOLE 40 MG PO CPDR: 40.0000 mg | DELAYED_RELEASE_CAPSULE | Freq: Every day | ORAL | 3 refills | Status: DC

## 2019-12-25 MED ORDER — ALBUTEROL SULFATE HFA 108 (90 BASE) MCG/ACT IN AERS: 2.0000 | INHALATION_SPRAY | Freq: Four times a day (QID) | RESPIRATORY_TRACT | 0 refills | Status: DC | PRN

## 2019-12-25 MED ORDER — AMLODIPINE BESYLATE 10 MG PO TABS: ORAL_TABLET | ORAL | 1 refills | Status: AC

## 2019-12-25 NOTE — Progress Notes (Signed)
Subjective:    Patient ID: Alexandria Pittman, female    DOB: Jul 10, 1936, 84 y.o.   MRN: [id number]  HPI  Pt is a 84 yo female with hypothyroidism, pulmonary emphysema, pernicious anemia who presents to the clinic with her daughter with worsening fatigue, SOB, weakness. She does cough but not productive. She denies any headache but her neck does hurt. She continues to have black tarry stools and bright red blood in stools. Her stools are hard and soft intermittently. She has some epigastric pain for a while but just "though it was how she was sitting". She tries not to cough because it will cause her to have a bowel movement and "spray blood". Her last visit appt was made for GI and she never went. Her hemoglobin was 9.6 and she never followed up on it.   .. Active Ambulatory Problems    Diagnosis Date Noted  . Essential hypertension, benign 05/13/2015  . Hypothyroidism 05/13/2015  . Pernicious anemia 05/13/2015  . Breast cancer, female ([place]) 05/13/2015  . Renal insufficiency 05/13/2015  . COPD, mild ([place]) 05/23/2015  . Metatarsalgia of both feet 05/31/2015  . Altered mental status 05/31/2015  . Mass of right side of neck 08/16/2015  . Hyperlipidemia 08/23/2015  . Degenerative disc disease, lumbar 08/07/2016  . Primary insomnia 08/07/2016  . Bilateral impacted cerumen 08/09/2016  . Pulmonary emphysema ([place]) 11/20/2016  . Bilateral hearing loss due to cerumen impaction 11/20/2016  . Chronic bilateral thoracic back pain 01/26/2018  . Hearing loss of right ear due to cerumen impaction 01/30/2018  . Current smoker 04/03/2019  . Blood in stool 10/17/2019  . SOB (shortness of breath) 10/17/2019  . Weakness 10/17/2019  . OSA (obstructive sleep apnea) 10/17/2019  . OAB (overactive bladder) 10/17/2019  . Stage 3a chronic kidney disease 12/25/2019  . Black stools 12/25/2019   Resolved Ambulatory Problems    Diagnosis Date Noted  . No Resolved Ambulatory Problems   No Additional Past  Medical History      Review of Systems See HPI.     Objective:   Physical Exam Vitals reviewed.  Constitutional:      Appearance: She is ill-appearing.     Comments: Pale and weak appearance.   HENT:     Head: Normocephalic.     Mouth/Throat:     Mouth: Mucous membranes are moist.  Cardiovascular:     Rate and Rhythm: Normal rate and regular rhythm.  Pulmonary:     Effort: Pulmonary effort is normal. Tachypnea present. No accessory muscle usage or respiratory distress.     Breath sounds: Examination of the right-middle field reveals decreased breath sounds and rhonchi. Examination of the left-middle field reveals decreased breath sounds and rhonchi. Examination of the right-lower field reveals decreased breath sounds. Examination of the left-lower field reveals decreased breath sounds. Decreased breath sounds and rhonchi present.  Chest:     Chest wall: No mass, tenderness or edema.  Abdominal:     Comments: Tenderness over epigastric area. No guarding or rebound.   Skin:    General: Skin is warm.  Neurological:     General: No focal deficit present.  Psychiatric:        Mood and Affect: Mood normal.        Behavior: Behavior normal.           Assessment & Plan:  Marland KitchenMarland KitchenMae was seen today for shortness of breath.  Diagnoses and all orders for this visit:  Blood in stool -  CBC with Differential/Platelet -     COMPLETE METABOLIC PANEL WITH GFR -     Fe+TIBC+Fer  Pulmonary emphysema, unspecified emphysema type ([facility]) -     DG Chest 2 View -     albuterol (VENTOLIN HFA) 108 (90 Base) MCG/ACT inhaler; Inhale 2 puffs into the lungs every 6 (six) hours as needed. -     Discontinue: albuterol (PROVENTIL) (2.5 MG/3ML) 0.083% nebulizer solution 2.5 mg -     albuterol (VENTOLIN HFA) 108 (90 Base) MCG/ACT inhaler 2 puff -     umeclidinium-vilanterol (ANORO ELLIPTA) 62.5-25 MCG/INH AEPB; Inhale 1 puff into the lungs daily.  Hypothyroidism, unspecified type  Pernicious  anemia  Stage 3a chronic kidney disease  SOB (shortness of breath) -     DG Chest 2 View -     Discontinue: albuterol (PROVENTIL) (2.5 MG/3ML) 0.083% nebulizer solution 2.5 mg -     albuterol (VENTOLIN HFA) 108 (90 Base) MCG/ACT inhaler 2 puff  Weakness  Essential hypertension, benign -     amLODipine (NORVASC) 10 MG tablet; TAKE 1 TABLET BY MOUTH DAILY.  Black stools -     omeprazole (PRILOSEC) 40 MG capsule; Take 1 capsule (40 mg total) by mouth daily.   Unclear etiology of SOB suspect anemia due to GI blood loss causing SOB since pulse ox is 97 percent and no improvement with peak flows with albuterol.  She has not been to GI and not followed up on 9.6 HgB.  Will recheck HgB today.  Get CXR today.  Continue anoro for COPD and as needed albuterol.  Very concerned about her black stools and occasional bright red blood stools for this long.  Sister had colonoscopy cancer.  She is having some epigastric pain. Start omeprazole once daily.  Keep close follow up. Will call tomorrow with lab results.   Spent 35 minutes with patient.

## 2019-12-25 NOTE — Patient Instructions (Signed)
Get labs.  Get CXR.

## 2019-12-26 ENCOUNTER — Other Ambulatory Visit: Payer: Self-pay | Admitting: Physician Assistant

## 2019-12-26 DIAGNOSIS — R0602 Shortness of breath: Secondary | ICD-10-CM

## 2019-12-26 LAB — CBC WITH DIFFERENTIAL/PLATELET
Absolute Monocytes: 598 cells/uL (ref 200–950)
Basophils Absolute: 39 cells/uL (ref 0–200)
Basophils Relative: 0.6 %
Eosinophils Absolute: 163 cells/uL (ref 15–500)
Eosinophils Relative: 2.5 %
HCT: 26.7 % — ABNORMAL LOW (ref 35.0–45.0)
Hemoglobin: 8 g/dL — ABNORMAL LOW (ref 11.7–15.5)
Lymphs Abs: 1099 cells/uL (ref 850–3900)
MCH: 22 pg — ABNORMAL LOW (ref 27.0–33.0)
MCHC: 30 g/dL — ABNORMAL LOW (ref 32.0–36.0)
MCV: 73.4 fL — ABNORMAL LOW (ref 80.0–100.0)
MPV: 10.8 fL (ref 7.5–12.5)
Monocytes Relative: 9.2 %
Neutro Abs: 4602 cells/uL (ref 1500–7800)
Neutrophils Relative %: 70.8 %
Platelets: 351 10*3/uL (ref 140–400)
RBC: 3.64 10*6/uL — ABNORMAL LOW (ref 3.80–5.10)
RDW: 15.6 % — ABNORMAL HIGH (ref 11.0–15.0)
Total Lymphocyte: 16.9 %
WBC: 6.5 10*3/uL (ref 3.8–10.8)

## 2019-12-26 LAB — COMPLETE METABOLIC PANEL WITH GFR
AG Ratio: 1.2 (calc) (ref 1.0–2.5)
ALT: 13 U/L (ref 6–29)
AST: 22 U/L (ref 10–35)
Albumin: 3.8 g/dL (ref 3.6–5.1)
Alkaline phosphatase (APISO): 106 U/L (ref 37–153)
BUN/Creatinine Ratio: 15 (calc) (ref 6–22)
BUN: 15 mg/dL (ref 7–25)
CO2: 24 mmol/L (ref 20–32)
Calcium: 8.6 mg/dL (ref 8.6–10.4)
Chloride: 106 mmol/L (ref 98–110)
Creat: 1.03 mg/dL — ABNORMAL HIGH (ref 0.60–0.88)
GFR, Est African American: 59 mL/min/{1.73_m2} — ABNORMAL LOW (ref >=60)
GFR, Est Non African American: 51 mL/min/{1.73_m2} — ABNORMAL LOW (ref >=60)
Globulin: 3.1 g/dL (calc) (ref 1.9–3.7)
Glucose, Bld: 105 mg/dL — ABNORMAL HIGH (ref 65–99)
Potassium: 4.2 mmol/L (ref 3.5–5.3)
Sodium: 138 mmol/L (ref 135–146)
Total Bilirubin: 0.4 mg/dL (ref 0.2–1.2)
Total Protein: 6.9 g/dL (ref 6.1–8.1)

## 2019-12-26 LAB — IRON,TIBC AND FERRITIN PANEL
%SAT: 4 % (calc) — ABNORMAL LOW (ref 16–45)
Ferritin: 19 ng/mL (ref 16–288)
Iron: 18 ug/dL — ABNORMAL LOW (ref 45–160)
TIBC: 448 mcg/dL (calc) (ref 250–450)

## 2019-12-26 MED ORDER — FUROSEMIDE 20 MG PO TABS: 20.0000 mg | ORAL_TABLET | Freq: Every day | ORAL | 0 refills | Status: DC

## 2019-12-26 MED ORDER — PREDNISONE 20 MG PO TABS: ORAL_TABLET | ORAL | 0 refills | Status: DC

## 2019-12-26 NOTE — Progress Notes (Signed)
Call pt:  As suspected hemoglobin dropped to 8.0 where 2 months ago was 9.6. because you are symptomatic I would like to get you in for transfusion. We can let Dr. Georgiann Cocker office do this and get them information or get you to someone else. What is your preference? Also we need to find out where bleeding is coming from. You need to see GI. You should be able to call and just make appt since referral was placed 2 months ago.

## 2019-12-26 NOTE — Progress Notes (Signed)
Call pt: you do have some fluid on lungs. I would like you to start prednisone and lasix 20mg  once a day. Recheck lungs in 1 week. OV in 1 week. I also ordered an echo to look at heart and how it is functioning. It does appear large on xray.

## 2019-12-29 ENCOUNTER — Other Ambulatory Visit: Payer: Self-pay

## 2019-12-29 ENCOUNTER — Ambulatory Visit (HOSPITAL_BASED_OUTPATIENT_CLINIC_OR_DEPARTMENT_OTHER)
Admission: RE | Admit: 2019-12-29 | Discharge: 2019-12-29 | Disposition: A | Discharge status: Home / Self Care | Payer: Medicare Other | Source: Ambulatory Visit | Attending: Physician Assistant | Admitting: Physician Assistant

## 2019-12-29 DIAGNOSIS — R0602 Shortness of breath: Secondary | ICD-10-CM | POA: Diagnosis not present

## 2019-12-29 NOTE — Progress Notes (Signed)
  Echocardiogram 2D Echocardiogram has been performed.  Alexandria Pittman 12/29/2019, 12:26 PM

## 2020-01-01 ENCOUNTER — Other Ambulatory Visit: Payer: Self-pay | Admitting: Physician Assistant

## 2020-01-01 ENCOUNTER — Other Ambulatory Visit: Payer: Self-pay

## 2020-01-01 ENCOUNTER — Ambulatory Visit (INDEPENDENT_AMBULATORY_CARE_PROVIDER_SITE_OTHER): Payer: Medicare Other | Admitting: Physician Assistant

## 2020-01-01 ENCOUNTER — Encounter: Payer: Self-pay | Admitting: Physician Assistant

## 2020-01-01 ENCOUNTER — Ambulatory Visit (INDEPENDENT_AMBULATORY_CARE_PROVIDER_SITE_OTHER): Payer: Medicare Other

## 2020-01-01 VITALS — BP 150/67 | HR 98 | Ht 64.0 in | Wt 182.0 lb

## 2020-01-01 DIAGNOSIS — I10 Essential (primary) hypertension: Secondary | ICD-10-CM

## 2020-01-01 DIAGNOSIS — R0602 Shortness of breath: Secondary | ICD-10-CM

## 2020-01-01 DIAGNOSIS — J189 Pneumonia, unspecified organism: Secondary | ICD-10-CM

## 2020-01-01 DIAGNOSIS — I3481 Nonrheumatic mitral (valve) annulus calcification: Secondary | ICD-10-CM | POA: Insufficient documentation

## 2020-01-01 DIAGNOSIS — I059 Rheumatic mitral valve disease, unspecified: Secondary | ICD-10-CM | POA: Insufficient documentation

## 2020-01-01 DIAGNOSIS — K921 Melena: Secondary | ICD-10-CM

## 2020-01-01 DIAGNOSIS — D509 Iron deficiency anemia, unspecified: Secondary | ICD-10-CM

## 2020-01-01 DIAGNOSIS — I272 Pulmonary hypertension, unspecified: Secondary | ICD-10-CM | POA: Insufficient documentation

## 2020-01-01 DIAGNOSIS — J90 Pleural effusion, not elsewhere classified: Secondary | ICD-10-CM

## 2020-01-01 LAB — CBC
HCT: 26.6 % — ABNORMAL LOW (ref 35.0–45.0)
Hemoglobin: 7.9 g/dL — ABNORMAL LOW (ref 11.7–15.5)
MCH: 21.4 pg — ABNORMAL LOW (ref 27.0–33.0)
MCHC: 29.7 g/dL — ABNORMAL LOW (ref 32.0–36.0)
MCV: 72.1 fL — ABNORMAL LOW (ref 80.0–100.0)
MPV: 10.6 fL (ref 7.5–12.5)
Platelets: 372 10*3/uL (ref 140–400)
RBC: 3.69 10*6/uL — ABNORMAL LOW (ref 3.80–5.10)
RDW: 15.8 % — ABNORMAL HIGH (ref 11.0–15.0)
WBC: 10.2 10*3/uL (ref 3.8–10.8)

## 2020-01-01 MED ORDER — LISINOPRIL 5 MG PO TABS: 5.0000 mg | ORAL_TABLET | Freq: Every day | ORAL | 2 refills | Status: DC

## 2020-01-01 NOTE — Progress Notes (Signed)
Call pts daughter-Alexandria Pittman EF 60-65 percent which is good.  Aortic valve look good with no stenosis.  Moderate Tricuspid regurgitation.  Your pulmonary artery pressure is elevated could be due to lung disease(COPD), cholesterol build up, high blood pressure not to goal.  Your mitral valve has degenerative disease with severe mitral annular calcification.   Certainly some reasons for some shortness of breath.   I would like for you to start 5mg  of lisinopril daily for BP and for heart.  I would like to see you in office this week for repeat CXR.  Have you seen GI yet for anemia and blood in stool?  I will make referral for cardiology to follow up on Echo.

## 2020-01-01 NOTE — Patient Instructions (Signed)
Get chest xray for follow.  Get CBC for follow up.  Get iron infusion.  Consult with GI.   Follow up in 4 weeks.

## 2020-01-01 NOTE — Progress Notes (Signed)
Subjective:    Patient ID: Alexandria Pittman, female    DOB: 1936-08-14, 84 y.o.   MRN: [id number]  HPI  Pt is a 84 yo female who presents to the clinic with her daughter to follow up on SOB, anemia, blood in stool.   She has ongoing HTN, COPD, OSA, hypothyroidism.   Pt is mostly concerned with SOB. She refuses colonoscopy but referral has been made. She has a sister who died of colon cancer.she has had bright red blood in stool for at least 3 months with microcytic anemia. She is supposed to go to hematology for IV iron infusion tomorrow.   Last visit did get CXR and had small right pleural effusion and interstitial edema. She was given lasix and did not take. She did finish prednisone. She feels a little better.   She did get echo and showed severe mitral valve calcification. Will good EF. Has appt on April 26th.   She just wants to breathe better. Ongoing slightly productive cough.   .. Active Ambulatory Problems    Diagnosis Date Noted  . Essential hypertension, benign 05/13/2015  . Hypothyroidism 05/13/2015  . Pernicious anemia 05/13/2015  . Breast cancer, female ([place]) 05/13/2015  . Renal insufficiency 05/13/2015  . COPD, mild ([place]) 05/23/2015  . Metatarsalgia of both feet 05/31/2015  . Altered mental status 05/31/2015  . Mass of right side of neck 08/16/2015  . Hyperlipidemia 08/23/2015  . Degenerative disc disease, lumbar 08/07/2016  . Primary insomnia 08/07/2016  . Bilateral impacted cerumen 08/09/2016  . Pulmonary emphysema ([place]) 11/20/2016  . Bilateral hearing loss due to cerumen impaction 11/20/2016  . Chronic bilateral thoracic back pain 01/26/2018  . Hearing loss of right ear due to cerumen impaction 01/30/2018  . Current smoker 04/03/2019  . Blood in stool 10/17/2019  . SOB (shortness of breath) 10/17/2019  . Weakness 10/17/2019  . OSA (obstructive sleep apnea) 10/17/2019  . OAB (overactive bladder) 10/17/2019  . Stage 3a chronic kidney disease 12/25/2019  .  Black stools 12/25/2019  . Family history of colon cancer 12/25/2019  . Pulmonary hypertension ([place]) 01/01/2020  . Mitral annular calcification 01/01/2020  . Iron deficiency anemia due to chronic blood loss 01/08/2020  . Pneumonitis 01/08/2020  . Pleural effusion, not elsewhere classified 01/08/2020   Resolved Ambulatory Problems    Diagnosis Date Noted  . No Resolved Ambulatory Problems   No Additional Past Medical History          Review of Systems See HPI.     Objective:   Physical Exam Vitals reviewed.  Constitutional:      Appearance: Normal appearance.  HENT:     Head: Normocephalic.     Right Ear: Tympanic membrane normal.     Left Ear: Tympanic membrane normal.     Nose: Nose normal.     Mouth/Throat:     Mouth: Mucous membranes are moist.  Neck:     Vascular: No carotid bruit.  Cardiovascular:     Rate and Rhythm: Normal rate and regular rhythm.     Pulses: Normal pulses.     Heart sounds: Murmur present.  Pulmonary:     Comments: Crackles bilateral base. Worse over right. Coarse breath sounds.  Musculoskeletal:     Right lower leg: No edema.     Left lower leg: No edema.  Lymphadenopathy:     Cervical: No cervical adenopathy.  Neurological:     General: No focal deficit present.     Mental Status: She  is alert and oriented to person, place, and time.  Psychiatric:        Mood and Affect: Mood normal.           Assessment & Plan:  Marland KitchenMarland KitchenMae was seen today for follow-up.  Diagnoses and all orders for this visit:  SOB (shortness of breath)  Pleural effusion, right -     DG Chest 2 View  Pneumonitis -     DG Chest 2 View  Microcytic anemia -     CBC  Pleural effusion, not elsewhere classified -     CT Chest W Contrast  Blood in stool   Suspect SOB is multifactoral. I suspect mainly due to hemoglobin being so low.  She is supposed to have IV iron infusion with hematology tomorrow.   6 minute walk test pulse ox never dropped below  94 percent but patient too SOB to continue.   Repeat CXR today to look at pleural effusion.  Encouraged patient to take lasix.   Very concerned for colon cancer due to blood loss and blood in stool as well as patients smoking hx and family hx. Pt is aware but declines going to GI.   Pt denies smoking cessation.   Concern for lung cancer/infection vs colon cancer.   Follow up in 3 weeks.   Addendum: CXR-persistent bronchial and interstitial thickening. Right nodular density right lung base. Will get CT of chest.

## 2020-01-02 NOTE — Progress Notes (Signed)
Discussed results with patient's daughter. Order CT of lung.

## 2020-01-02 NOTE — Progress Notes (Signed)
Call kelly her daughter: From one week dropped from 82 to 7.9.

## 2020-01-08 ENCOUNTER — Encounter: Payer: Self-pay | Admitting: Physician Assistant

## 2020-01-08 DIAGNOSIS — D500 Iron deficiency anemia secondary to blood loss (chronic): Secondary | ICD-10-CM | POA: Insufficient documentation

## 2020-01-08 DIAGNOSIS — J90 Pleural effusion, not elsewhere classified: Secondary | ICD-10-CM | POA: Insufficient documentation

## 2020-01-08 DIAGNOSIS — J189 Pneumonia, unspecified organism: Secondary | ICD-10-CM | POA: Insufficient documentation

## 2020-01-15 ENCOUNTER — Other Ambulatory Visit: Payer: Self-pay

## 2020-01-15 ENCOUNTER — Other Ambulatory Visit: Payer: Self-pay | Admitting: Physician Assistant

## 2020-01-15 ENCOUNTER — Ambulatory Visit (INDEPENDENT_AMBULATORY_CARE_PROVIDER_SITE_OTHER): Payer: Medicare Other

## 2020-01-15 DIAGNOSIS — R918 Other nonspecific abnormal finding of lung field: Secondary | ICD-10-CM

## 2020-01-15 DIAGNOSIS — J90 Pleural effusion, not elsewhere classified: Secondary | ICD-10-CM | POA: Diagnosis not present

## 2020-01-15 DIAGNOSIS — J432 Centrilobular emphysema: Secondary | ICD-10-CM

## 2020-01-15 DIAGNOSIS — F17200 Nicotine dependence, unspecified, uncomplicated: Secondary | ICD-10-CM

## 2020-01-15 DIAGNOSIS — R911 Solitary pulmonary nodule: Secondary | ICD-10-CM

## 2020-01-15 MED ORDER — IOHEXOL 300 MG/ML  SOLN
100.0000 mL | Freq: Once | INTRAMUSCULAR | Status: AC | PRN
  Administered 2020-01-15: 12:00:00 75 mL via INTRAVENOUS

## 2020-01-15 NOTE — Progress Notes (Signed)
I attempted to call personally. Noone picked up and no way to leave msg. Please call. I am more than willing to speak to them if needed as well.  Right upper lobe mass, right middle lobe nodule.  Right lyphmnode swelling in lungs.   This is very concerning for lung cancer.   Pt needs PET scan for further evaluation and seen by oncology. Will make referral.

## 2020-01-15 NOTE — Progress Notes (Signed)
I made referral to [facility] but patient is established with [facility] Dr. Georgiann Cocker. Can we send this information over to her office?   Discussed results with patients daughter and next steps.

## 2020-01-16 ENCOUNTER — Encounter: Payer: Self-pay | Admitting: *Deleted

## 2020-01-16 NOTE — Progress Notes (Signed)
Called to speak with patient and family about new patient referral. Spoke to [place] who states that the patient is already established with Dr Georgiann Cocker at [facility]. They do not want a referral to our office. Message sent to referring provider and referral to this office closed.

## 2020-01-17 ENCOUNTER — Ambulatory Visit: Payer: Medicare Other | Admitting: Hematology & Oncology

## 2020-01-17 ENCOUNTER — Other Ambulatory Visit: Payer: Medicare Other

## 2020-01-17 NOTE — Progress Notes (Signed)
Thanks

## 2020-01-19 ENCOUNTER — Other Ambulatory Visit: Payer: Self-pay | Admitting: Physician Assistant

## 2020-01-29 ENCOUNTER — Other Ambulatory Visit: Payer: Self-pay

## 2020-01-29 ENCOUNTER — Ambulatory Visit (INDEPENDENT_AMBULATORY_CARE_PROVIDER_SITE_OTHER): Payer: Medicare Other | Admitting: Physician Assistant

## 2020-01-29 ENCOUNTER — Encounter: Payer: Self-pay | Admitting: Physician Assistant

## 2020-01-29 VITALS — BP 131/52 | HR 50 | Ht 64.0 in | Wt 173.0 lb

## 2020-01-29 DIAGNOSIS — N1831 Chronic kidney disease, stage 3a: Secondary | ICD-10-CM

## 2020-01-29 DIAGNOSIS — F17200 Nicotine dependence, unspecified, uncomplicated: Secondary | ICD-10-CM

## 2020-01-29 DIAGNOSIS — I10 Essential (primary) hypertension: Secondary | ICD-10-CM

## 2020-01-29 DIAGNOSIS — I272 Pulmonary hypertension, unspecified: Secondary | ICD-10-CM | POA: Diagnosis not present

## 2020-01-29 DIAGNOSIS — D649 Anemia, unspecified: Secondary | ICD-10-CM

## 2020-01-29 DIAGNOSIS — Z800 Family history of malignant neoplasm of digestive organs: Secondary | ICD-10-CM

## 2020-01-29 DIAGNOSIS — D500 Iron deficiency anemia secondary to blood loss (chronic): Secondary | ICD-10-CM

## 2020-01-29 DIAGNOSIS — C7801 Secondary malignant neoplasm of right lung: Secondary | ICD-10-CM

## 2020-01-29 DIAGNOSIS — J432 Centrilobular emphysema: Secondary | ICD-10-CM

## 2020-01-29 DIAGNOSIS — K921 Melena: Secondary | ICD-10-CM

## 2020-01-29 DIAGNOSIS — R0602 Shortness of breath: Secondary | ICD-10-CM

## 2020-01-29 DIAGNOSIS — D510 Vitamin B12 deficiency anemia due to intrinsic factor deficiency: Secondary | ICD-10-CM

## 2020-01-29 NOTE — Progress Notes (Signed)
Subjective:    Patient ID: Alexandria Pittman, female    DOB: [birth year], 84 y.o.   MRN: [id number]  HPI  Patient is an 84 year old female who presents to the clinic with her daughter to follow-up after oncology and gastroenterology.  Patient has an extensive past medical history of breast cancer in remission, hypertension, COPD, CKD 3 she is recently diagnosed with a suspected malignant neoplasm of right lung with possible metastasis to colon or vice versa with primary colon malignancy with metastasis to right lung.  She has been followed by Dr. Verdell Carmine, oncologist and she is ordered a PET scan for tomorrow.  She also sees gastroenterology but right now they are waiting for the work-up by oncology.  GI did order a CEA tumor marker was elevated at 84.  Patient does feel like her shortness of breath has improved since iron infusions.  She does feel that she is got more energy and can ambulate better.  Patient does continue to smoke.  She is very sedentary.  .. Active Ambulatory Problems    Diagnosis Date Noted  . Essential hypertension, benign 05/13/2015  . Hypothyroidism 05/13/2015  . Pernicious anemia 05/13/2015  . Breast cancer, female ([place]) 05/13/2015  . Renal insufficiency 05/13/2015  . COPD, mild ([place]) 05/23/2015  . Metatarsalgia of both feet 05/31/2015  . Altered mental status 05/31/2015  . Mass of right side of neck 08/16/2015  . Hyperlipidemia 08/23/2015  . Degenerative disc disease, lumbar 08/07/2016  . Primary insomnia 08/07/2016  . Bilateral impacted cerumen 08/09/2016  . Pulmonary emphysema ([place]) 11/20/2016  . Bilateral hearing loss due to cerumen impaction 11/20/2016  . Chronic bilateral thoracic back pain 01/26/2018  . Hearing loss of right ear due to cerumen impaction 01/30/2018  . Current smoker 04/03/2019  . Blood in stool 10/17/2019  . SOB (shortness of breath) 10/17/2019  . Weakness 10/17/2019  . OSA (obstructive sleep apnea) 10/17/2019  . OAB (overactive  bladder) 10/17/2019  . Stage 3a chronic kidney disease 12/25/2019  . Hematochezia 12/25/2019  . Family history of colon cancer 12/25/2019  . Pulmonary hypertension ([place]) 01/01/2020  . Mitral annular calcification 01/01/2020  . Iron deficiency anemia due to chronic blood loss 01/08/2020  . Pneumonitis 01/08/2020  . Pleural effusion, not elsewhere classified 01/08/2020   Resolved Ambulatory Problems    Diagnosis Date Noted  . No Resolved Ambulatory Problems   Past Medical History:  Diagnosis Date  . Hypertension      Review of Systems See HPI.     Objective:   Physical Exam Vitals reviewed.  Cardiovascular:     Rate and Rhythm: Normal rate.     Pulses: Normal pulses.     Heart sounds: Murmur present.  Pulmonary:     Comments: Slightly labored breathing. Pulse ox still 97 percent. Coarse breath sounds bilateral lungs.   Rhonchi and rales in right lungs.  Musculoskeletal:     Right lower leg: No edema.     Left lower leg: No edema.  Neurological:     General: No focal deficit present.     Mental Status: She is alert.     Gait: Gait normal.  Psychiatric:        Mood and Affect: Mood normal.           Assessment & Plan:  Marland KitchenMarland KitchenMae was seen today for follow-up.  Diagnoses and all orders for this visit:  Malignant neoplasm metastatic to right lung (HCC)  Anemia, unspecified type -  CBC with Differential/Platelet -     Fe+TIBC+Fer  Essential hypertension, benign  Pulmonary hypertension (HCC)  Centrilobular emphysema (HCC)  Stage 3a chronic kidney disease  Family history of colon cancer  Pernicious anemia  Iron deficiency anemia due to chronic blood loss  SOB (shortness of breath)  Current smoker  Hematochezia   Discussed with patient and daughter in office that it is evident that she has some type of cancer but we are figuring out the primary source and the best treatment plan and prognosis.  Patient is frustrated with since her many providers.   I explained to her that this is a team approach and she has multiple organ systems affected.  I would like her to continue close follow-up with [facility] with iron infusions since they are making her feel so much better.  We will recheck her CBC today and see if there is any improvement on the hemoglobin.  Blood pressure stable today.  Did encourage her to stop smoking.  Patient declined.  Obviously patient and daughter want more specific answers.  I explained I cannot do this today.  We just needed more testing and information. Is the PET scan.  Spent 30 minutes with patient and daughter answering questions.

## 2020-01-30 ENCOUNTER — Encounter: Payer: Self-pay | Admitting: Physician Assistant

## 2020-01-30 LAB — CBC WITH DIFFERENTIAL/PLATELET
Absolute Monocytes: 470 cells/uL (ref 200–950)
Basophils Absolute: 30 cells/uL (ref 0–200)
Basophils Relative: 0.6 %
Eosinophils Absolute: 100 cells/uL (ref 15–500)
Eosinophils Relative: 2 %
HCT: 35.4 % (ref 35.0–45.0)
Hemoglobin: 10.3 g/dL — ABNORMAL LOW (ref 11.7–15.5)
Lymphs Abs: 1245 cells/uL (ref 850–3900)
MCH: 22.1 pg — ABNORMAL LOW (ref 27.0–33.0)
MCHC: 29.1 g/dL — ABNORMAL LOW (ref 32.0–36.0)
MCV: 76 fL — ABNORMAL LOW (ref 80.0–100.0)
MPV: 10 fL (ref 7.5–12.5)
Monocytes Relative: 9.4 %
Neutro Abs: 3155 cells/uL (ref 1500–7800)
Neutrophils Relative %: 63.1 %
Platelets: 378 10*3/uL (ref 140–400)
RBC: 4.66 10*6/uL (ref 3.80–5.10)
RDW: 20.1 % — ABNORMAL HIGH (ref 11.0–15.0)
Total Lymphocyte: 24.9 %
WBC: 5 10*3/uL (ref 3.8–10.8)

## 2020-01-30 LAB — IRON,TIBC AND FERRITIN PANEL
%SAT: 7 % (calc) — ABNORMAL LOW (ref 16–45)
Ferritin: 89 ng/mL (ref 16–288)
Iron: 27 ug/dL — ABNORMAL LOW (ref 45–160)
TIBC: 397 mcg/dL (calc) (ref 250–450)

## 2020-01-30 NOTE — Progress Notes (Signed)
Call daughter kelly: hemoglobin 10.3 up from 7.9. iron stores and iron increased. Iron infusions working.

## 2020-02-01 NOTE — Progress Notes (Signed)
Referring-Jade Breeback PA-C Reason for referral-pulmonary hypertension  HPI: 84 year old female for evaluation of pulmonary hypertension at request of Iran Planas PA-C. Echocardiogram March 2021 showed normal LV function, mild to moderate left atrial enlargement, moderate tricuspid regurgitation and severely elevated pulmonary pressure with tricuspid regurgitation max velocity of 4 m/s. Patient recently complained of increased dyspnea. She has been treated with steroids and Lasix.  Also found to have anemic with GI blood loss. Has seen GI; CEA 84.  Being treated with iron by hematology. Also with history of breast cancer. Chest CT April 2021 showed a 3.9 cm apical right upper lobe masslike opacity, 2 cm right middle lobe pulmonary nodule, right hilar/right paratracheal and right prevascular adenopathy, small right pleural effusion, moderate COPD. PET/CT showed hypermetabolic findings in the lungs, mediastinal and hilar lymph nodes consistent with malignancy and hypermetabolic findings in the sigmoid colon.. Patient was recently anemic as outlined above.  During that time she had increased fatigue, dyspnea on exertion and chest pain with exertion relieved with rest..  She got iron and her hemoglobin improved.  She now has some dyspnea on exertion but no exertional chest pain.  She denies orthopnea, PND, pedal edema or syncope.  Current Outpatient Medications  Medication Sig Dispense Refill  . albuterol (VENTOLIN HFA) 108 (90 Base) MCG/ACT inhaler Inhale 2 puffs into the lungs every 6 (six) hours as needed. 18 g 0  . amLODipine (NORVASC) 10 MG tablet TAKE 1 TABLET BY MOUTH DAILY. 90 tablet 1  . cholecalciferol (VITAMIN D) 1000 units tablet Take 2,000 Units by mouth daily.     Marland Kitchen latanoprost (XALATAN) 0.005 % ophthalmic solution INSTILL 1 DROP INTO BOTH EYES EVERY DAY AT NIGHT  11  . levothyroxine (SYNTHROID) 100 MCG tablet TAKE 1 TABLET BY MOUTH EVERY DAY 90 tablet 3   No current  facility-administered medications for this visit.    No Known Allergies   Past Medical History:  Diagnosis Date  . Breast cancer, female ([place]) 05/13/2015  . COPD (chronic obstructive pulmonary disease) ([place])   . Hypertension   . Hypothyroid   . Pernicious anemia 05/13/2015    Past Surgical History:  Procedure Laterality Date  . MASTECTOMY Left   . TONSILLECTOMY      Social History   Socioeconomic History  . Marital status: Divorced    Spouse name: Not on file  . Number of children: 2  . Years of education: Not on file  . Highest education level: Not on file  Occupational History  . Not on file  Tobacco Use  . Smoking status: Current Every Day Smoker  . Smokeless tobacco: Never Used  Substance and Sexual Activity  . Alcohol use: Never    Alcohol/week: 0.0 standard drinks  . Drug use: Not on file  . Sexual activity: Not on file  Other Topics Concern  . Not on file  Social History Narrative  . Not on file   Social Determinants of Health   Financial Resource Strain:   . Difficulty of Paying Living Expenses:   Food Insecurity:   . Worried About Charity fundraiser in the Last Year:   . Arboriculturist in the Last Year:   Transportation Needs:   . Film/video editor (Medical):   Marland Kitchen Lack of Transportation (Non-Medical):   Physical Activity:   . Days of Exercise per Week:   . Minutes of Exercise per Session:   Stress:   . Feeling of Stress :   Social  Connections:   . Frequency of Communication with Friends and Family:   . Frequency of Social Gatherings with Friends and Family:   . Attends Religious Services:   . Active Member of Clubs or Organizations:   . Attends Archivist Meetings:   Marland Kitchen Marital Status:   Intimate Partner Violence:   . Fear of Current or Ex-Partner:   . Emotionally Abused:   Marland Kitchen Physically Abused:   . Sexually Abused:     Family History  Problem Relation Age of Onset  . Brain cancer Mother     ROS: Hematochezia but no  fevers or chills, productive cough, hemoptysis, dysphasia, odynophagia, melena,  dysuria, hematuria, rash, seizure activity, orthopnea, PND, pedal edema, claudication. Remaining systems are negative.  Physical Exam:   Blood pressure 136/74, pulse (!) 103, height 5\' 4"  (1.626 m), weight 174 lb 6.4 oz (79.1 kg).  General:  Well developed/well nourished in NAD Skin warm/dry Patient not depressed No peripheral clubbing Back-normal HEENT-normal/normal eyelids Neck supple/normal carotid upstroke bilaterally; no bruits; no JVD; no thyromegaly chest -diminished breath sounds throughout CV - RRR/normal S1 and S2; no murmurs, rubs or gallops;  PMI nondisplaced Abdomen -NT/ND, no HSM, no mass, + bowel sounds, no bruit 2+ femoral pulses, no bruits Ext-no edema, chords, 2+ DP Neuro-grossly nonfocal  ECG -sinus tachycardia at a rate of 103, right axis deviation, cannot rule out anterior infarct.  Right atrial enlargement, left atrial enlargement.  Personally reviewed  A/P  1 pulmonary hypertension-this is likely secondary to COPD due to longstanding tobacco use.  2 lung mass-patient with lung mass and also sigmoid mass.  Further evaluation per oncology.  3 chest pain-patient was recently anemic and described chest pain with exertion relieved with rest.  Following iron and improvement in hemoglobin her symptoms have resolved with no recurrent chest pain.  She likely has underlying coronary disease which was exacerbated by anemia.  I will not add aspirin given recent hematochezia and need for biopsies.  We will continue amlodipine.  I will not add beta-blockade given underlying COPD.  I will add Crestor 20 mg daily.  Check lipids and liver in 12 weeks.  4 COPD-Per primary care.  5 hypertension-continue amlodipine.  Kirk Ruths, MD

## 2020-02-07 ENCOUNTER — Encounter: Payer: Self-pay | Admitting: Cardiology

## 2020-02-07 ENCOUNTER — Ambulatory Visit (INDEPENDENT_AMBULATORY_CARE_PROVIDER_SITE_OTHER): Payer: Medicare Other | Admitting: Cardiology

## 2020-02-07 ENCOUNTER — Other Ambulatory Visit: Payer: Self-pay

## 2020-02-07 VITALS — BP 136/74 | HR 103 | Ht 64.0 in | Wt 174.4 lb

## 2020-02-07 DIAGNOSIS — E7800 Pure hypercholesterolemia, unspecified: Secondary | ICD-10-CM

## 2020-02-07 DIAGNOSIS — I10 Essential (primary) hypertension: Secondary | ICD-10-CM

## 2020-02-07 DIAGNOSIS — I272 Pulmonary hypertension, unspecified: Secondary | ICD-10-CM | POA: Diagnosis not present

## 2020-02-07 MED ORDER — ROSUVASTATIN CALCIUM 20 MG PO TABS: 20.0000 mg | ORAL_TABLET | Freq: Every day | ORAL | 3 refills | Status: AC

## 2020-02-07 NOTE — Patient Instructions (Signed)
Medication Instructions:  START ROSUVASTATIN 20 MG ONCE DAILY  *If you need a refill on your cardiac medications before your next appointment, please call your pharmacy*   Lab Work: Your physician recommends that you return for lab work in: [place]  If you have labs (blood work) drawn today and your tests are completely normal, you will receive your results only by: Marland Kitchen MyChart Message (if you have MyChart) OR . A paper copy in the mail If you have any lab test that is abnormal or we need to change your treatment, we will call you to review the results.   Follow-Up: At [facility], you and your health needs are our priority.  As part of our continuing mission to provide you with exceptional heart care, we have created designated Provider Care Teams.  These Care Teams include your primary Cardiologist (physician) and Advanced Practice Providers (APPs -  Physician Assistants and Nurse Practitioners) who all work together to provide you with the care you need, when you need it.  We recommend signing up for the patient portal called "MyChart".  Sign up information is provided on this After Visit Summary.  MyChart is used to connect with patients for Virtual Visits (Telemedicine).  Patients are able to view lab/test results, encounter notes, upcoming appointments, etc.  Non-urgent messages can be sent to your provider as well.   To learn more about what you can do with MyChart, go to [email or web address].    Your next appointment:   3 month(s)  The format for your next appointment:   In Person  Provider:   Kirk Ruths, MD

## 2020-02-26 ENCOUNTER — Telehealth: Payer: Self-pay

## 2020-02-26 NOTE — Telephone Encounter (Signed)
Alexandria Pittman's daughter Claiborne Billings called is wanting to get more information about the biopsy. She states her mom smoked a cigarette this morning even though she was advised not to eat, drink or smoke after 12 pm. She states she is going to cancel the biopsy.

## 2020-02-26 NOTE — Telephone Encounter (Signed)
Ok I misunderstood I thought they had question about biopsy results. Before cancel let them know it what and when she smoked they may still be able to do it. Do they still need a phone call?

## 2020-02-26 NOTE — Telephone Encounter (Signed)
Sorry, I should have mentioned in earlier message. I did advise her to call their office. She stated she was just going to cancel. She states this was 5 things against her and this was the last sign to not have the biopsy done.

## 2020-04-19 NOTE — Progress Notes (Deleted)
HPI: FU pulmonary hypertension. Echocardiogram March 2021 showed normal LV function, mild to moderate left atrial enlargement, moderate tricuspid regurgitation and severely elevated pulmonary pressure with tricuspid regurgitation max velocity of 4 m/s. Patient recently complained of increased dyspnea. She has been treated with steroids and Lasix.  Also found to have anemic with GI blood loss. Has seen GI; CEA 84.  Being treated with iron by hematology. Also with history of breast cancer. Chest CT April 2021 showed a 3.9 cm apical right upper lobe masslike opacity, 2 cm right middle lobe pulmonary nodule, right hilar/right paratracheal and right prevascular adenopathy, small right pleural effusion, moderate COPD. PET/CT showed hypermetabolic findings in the lungs, mediastinal and hilar lymph nodes consistent with malignancy and hypermetabolic findings in the sigmoid colon..During time of previously diagnosed anemia she had increased fatigue, dyspnea on exertion and chest pain with exertion relieved with rest..  She was treated with iron and her hemoglobin and symptoms improved.  At previous office visit pulmonary hypertension was felt secondary to COPD.  We felt previously that she likely had underlying coronary disease and added a statin.  Aspirin was held due to ongoing hematochezia and need for biopsies.  She subsequently has been diagnosed with adenocarcinoma of the lung.  She has been treated with immunotherapy.  Since last seen  Current Outpatient Medications  Medication Sig Dispense Refill  . albuterol (VENTOLIN HFA) 108 (90 Base) MCG/ACT inhaler Inhale 2 puffs into the lungs every 6 (six) hours as needed. 18 g 0  . amLODipine (NORVASC) 10 MG tablet TAKE 1 TABLET BY MOUTH DAILY. 90 tablet 1  . cholecalciferol (VITAMIN D) 1000 units tablet Take 2,000 Units by mouth daily.     Marland Kitchen latanoprost (XALATAN) 0.005 % ophthalmic solution INSTILL 1 DROP INTO BOTH EYES EVERY DAY AT NIGHT  11  .  levothyroxine (SYNTHROID) 100 MCG tablet TAKE 1 TABLET BY MOUTH EVERY DAY 90 tablet 3  . rosuvastatin (CRESTOR) 20 MG tablet Take 1 tablet (20 mg total) by mouth daily. 90 tablet 3   No current facility-administered medications for this visit.     Past Medical History:  Diagnosis Date  . Breast cancer, female ([place]) 05/13/2015  . COPD (chronic obstructive pulmonary disease) ([place])   . Hypertension   . Hypothyroid   . Pernicious anemia 05/13/2015    Past Surgical History:  Procedure Laterality Date  . MASTECTOMY Left   . TONSILLECTOMY      Social History   Socioeconomic History  . Marital status: Divorced    Spouse name: Not on file  . Number of children: 2  . Years of education: Not on file  . Highest education level: Not on file  Occupational History  . Not on file  Tobacco Use  . Smoking status: Current Every Day Smoker  . Smokeless tobacco: Never Used  Substance and Sexual Activity  . Alcohol use: Never    Alcohol/week: 0.0 standard drinks  . Drug use: Not on file  . Sexual activity: Not on file  Other Topics Concern  . Not on file  Social History Narrative  . Not on file   Social Determinants of Health   Financial Resource Strain:   . Difficulty of Paying Living Expenses:   Food Insecurity:   . Worried About Charity fundraiser in the Last Year:   . Arboriculturist in the Last Year:   Transportation Needs:   . Film/video editor (Medical):   Marland Kitchen Lack  of Transportation (Non-Medical):   Physical Activity:   . Days of Exercise per Week:   . Minutes of Exercise per Session:   Stress:   . Feeling of Stress :   Social Connections:   . Frequency of Communication with Friends and Family:   . Frequency of Social Gatherings with Friends and Family:   . Attends Religious Services:   . Active Member of Clubs or Organizations:   . Attends Archivist Meetings:   Marland Kitchen Marital Status:   Intimate Partner Violence:   . Fear of Current or Ex-Partner:   .  Emotionally Abused:   Marland Kitchen Physically Abused:   . Sexually Abused:     Family History  Problem Relation Age of Onset  . Brain cancer Mother     ROS: no fevers or chills, productive cough, hemoptysis, dysphasia, odynophagia, melena, hematochezia, dysuria, hematuria, rash, seizure activity, orthopnea, PND, pedal edema, claudication. Remaining systems are negative.  Physical Exam: Well-developed well-nourished in no acute distress.  Skin is warm and dry.  HEENT is normal.  Neck is supple.  Chest is clear to auscultation with normal expansion.  Cardiovascular exam is regular rate and rhythm.  Abdominal exam nontender or distended. No masses palpated. Extremities show no edema. neuro grossly intact  ECG- personally reviewed  A/P  1 pulmonary hypertension-this is likely secondary to longstanding COPD due to tobacco abuse.  2 chest pain-patient had chest pain with exertion relieved with rest but this occurred in the setting of anemia.  She has been treated with iron and her anemia has improved.  Her symptoms have now resolved.  We will continue statin.  I will add aspirin 81 mg daily.  Continue amlodipine.  Given multiple comorbidities we will not pursue aggressive ischemia evaluation unless she develops recurrent symptoms.  3 recently diagnosed adenocarcinoma of the lung-management per oncology.  4 hypertension-blood pressure controlled.  Continue present medications.  5 COPD-Per primary care.  Kirk Ruths, MD

## 2020-04-24 ENCOUNTER — Ambulatory Visit: Payer: Medicare Other | Admitting: Cardiology

## 2020-05-07 ENCOUNTER — Other Ambulatory Visit: Payer: Self-pay | Admitting: Physician Assistant

## 2020-05-07 DIAGNOSIS — J439 Emphysema, unspecified: Secondary | ICD-10-CM

## 2020-05-20 ENCOUNTER — Encounter: Payer: Self-pay | Admitting: *Deleted

## 2020-07-01 ENCOUNTER — Telehealth: Payer: Self-pay | Admitting: Physician Assistant

## 2020-07-01 ENCOUNTER — Other Ambulatory Visit: Payer: Self-pay | Admitting: Physician Assistant

## 2020-07-01 NOTE — Telephone Encounter (Signed)
Please change to deceased. 

## 2020-07-12 DEATH — deceased

## 2020-07-17 ENCOUNTER — Ambulatory Visit: Payer: Medicare Other | Admitting: Cardiology

## 2020-08-06 IMAGING — CT CT CHEST W/ CM
2 of 4 series · 14 of 36 positions shown, 17 images · IV contrast (omnipaque)
Comparison: 01/01/2020 chest radiograph.

CLINICAL DATA: Abnormal chest radiograph with possible right
pulmonary nodule. Dyspnea. History of left breast cancer.

EXAM:
CT CHEST WITH CONTRAST
TECHNIQUE: Multidetector CT imaging of the chest was performed during
intravenous contrast administration.
CONTRAST:  75mL OMNIPAQUE IOHEXOL 300 MG/ML  SOLN

[Series 4: lungs · axial · 0.68mm/px · z∈[-306,-34]mm · 11 of 152 slices shown, 14 images]
[im 8/152  mediastinal]
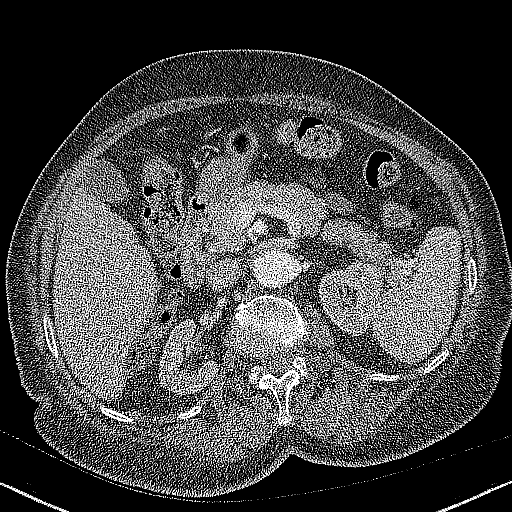
[im 8/152  lung]
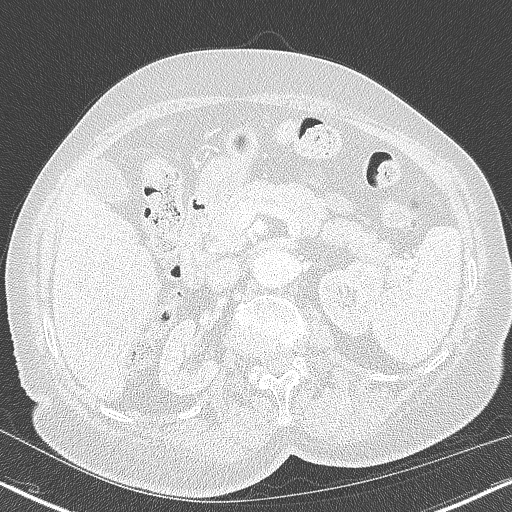
[im 24/152  lung]
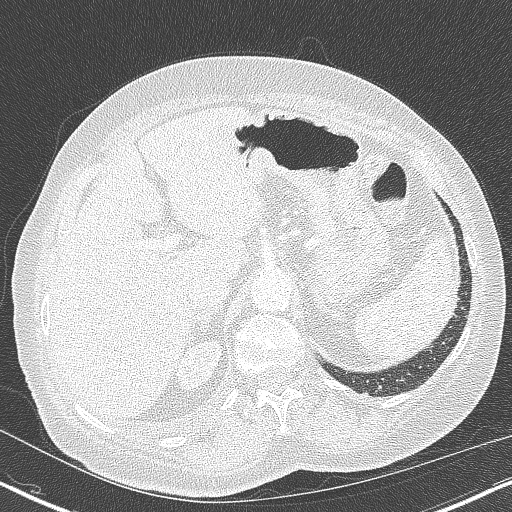
[im 40/152  lung]
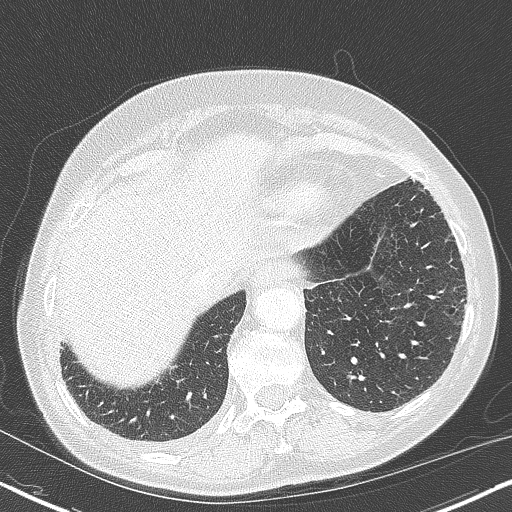
[im 48/152  lung]
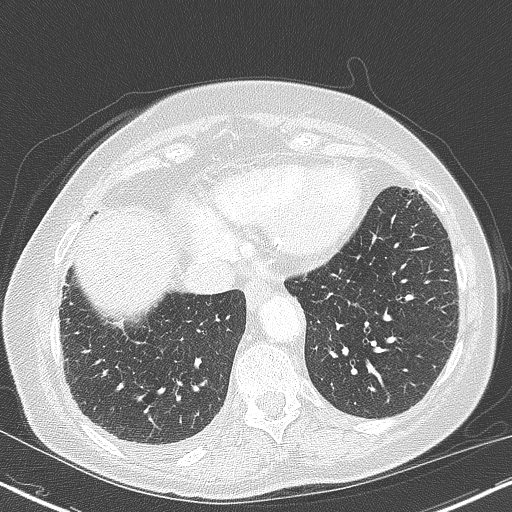
[im 64/152  mediastinal]
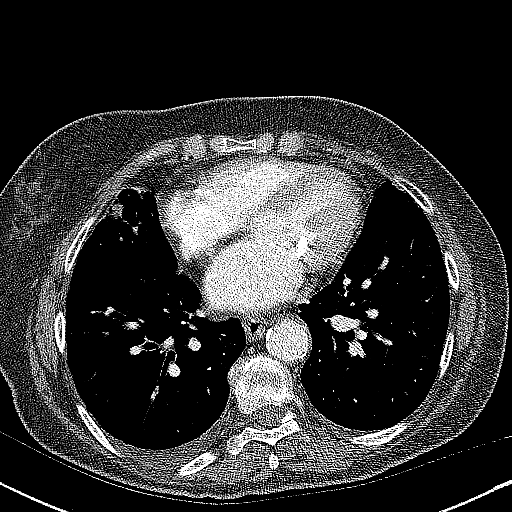
[im 64/152  lung]
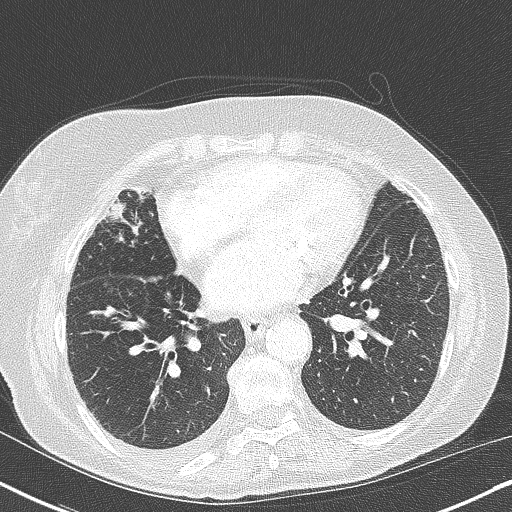
[im 80/152  lung]
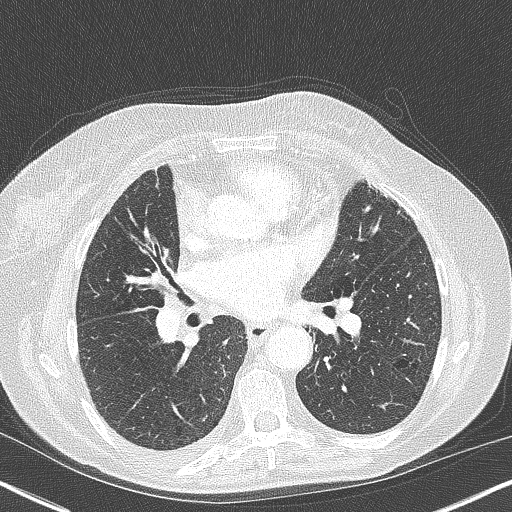
[im 88/152  lung]
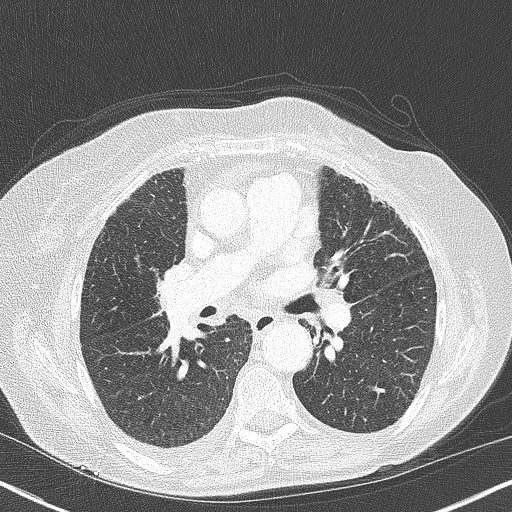
[im 104/152  lung]
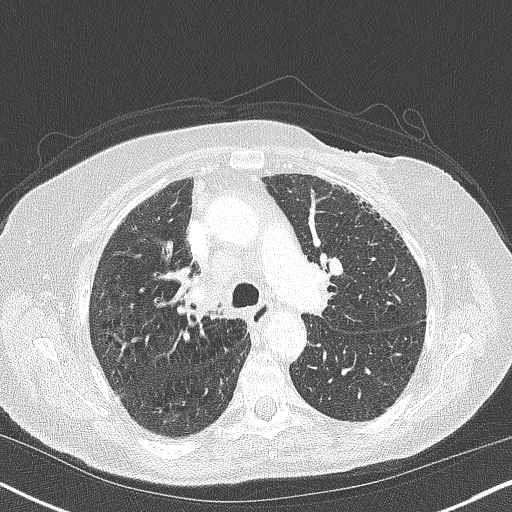
[im 112/152  mediastinal]
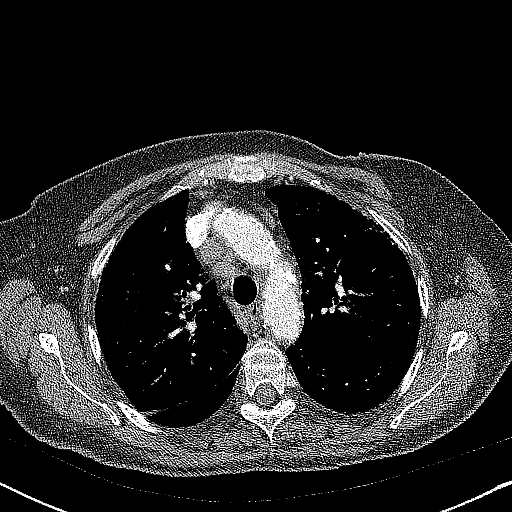
[im 112/152  lung]
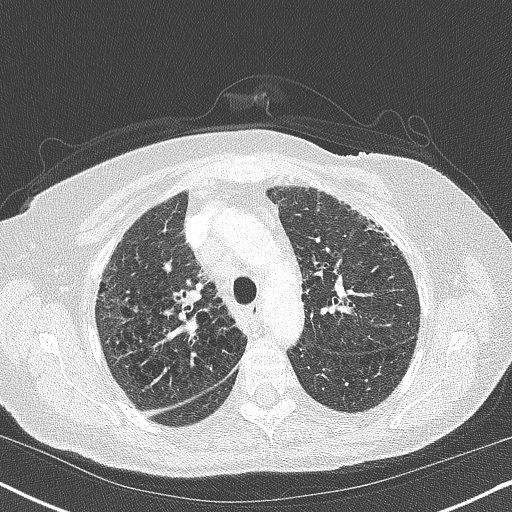
[im 128/152  lung]
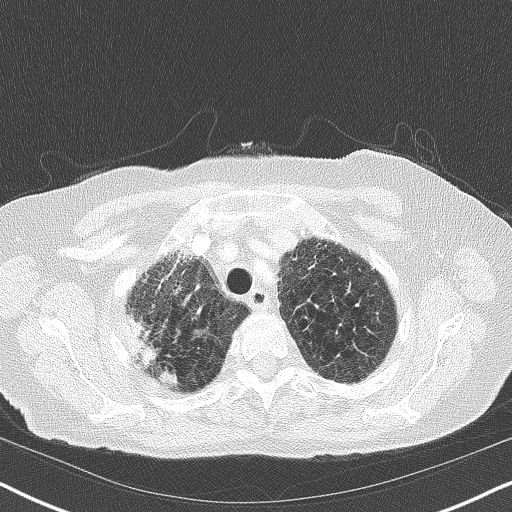
[im 144/152  lung]
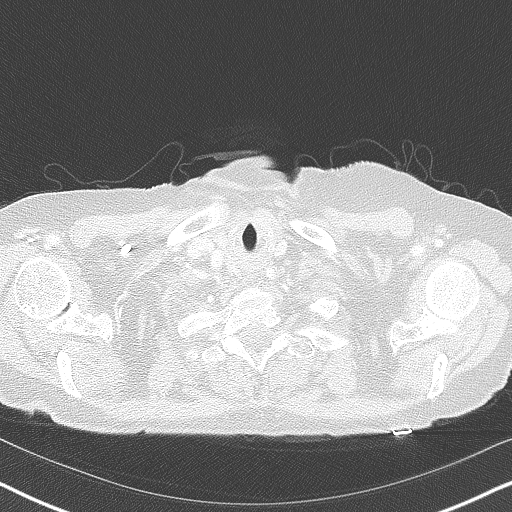

[Series 5: coronal · coronal · 0.64mm/px · 3 of 139 slices shown]
[im 28/139  lung]
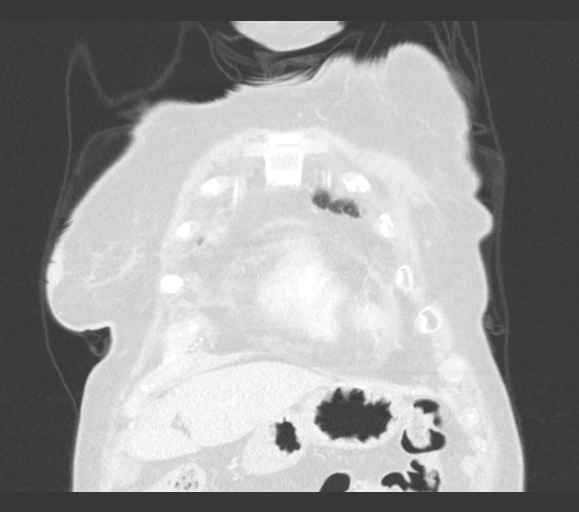
[im 56/139  lung]
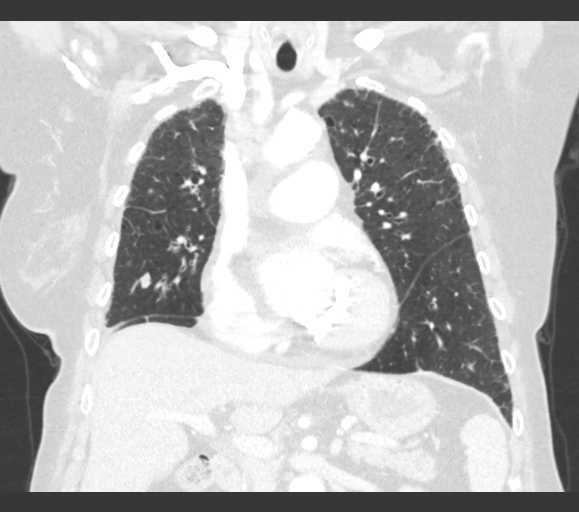
[im 83/139  lung]
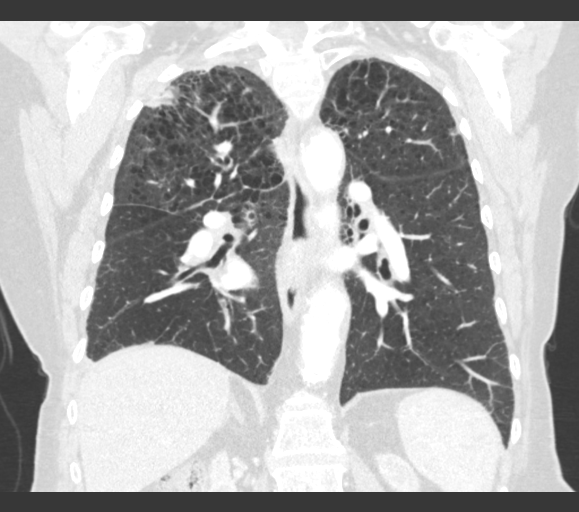

[14 of 36 positions shown; findings below may reference images not displayed]

FINDINGS: Cardiovascular: Top-normal heart size. No significant pericardial
effusion/thickening. Atherosclerotic nonaneurysmal thoracic aorta.
Top-normal caliber main pulmonary artery (3.1 cm diameter). No
central pulmonary emboli.

Mediastinum/Nodes: Subcentimeter hypodense posterior right thyroid
nodule. No followup recommended (ref: [HOSPITAL]. [DATE]): 143-50). Unremarkable esophagus. No axillary adenopathy.
Multiple enlarged right paratracheal nodes, largest 1.8 cm (series
2/image 20). Enlarged 1.3 cm right prevascular node (series 2/image
19). Enlarged 1.5 cm right hilar node (series 2/image 23). No left
hilar adenopathy.

Lungs/Pleura: No pneumothorax. Small dependent right pleural
effusion. No left pleural effusion. Moderate centrilobular and
paraseptal emphysema with diffuse bronchial wall thickening.
Irregular subpleural spiculated solid 3.9 x 2.1 cm posterior apical
right upper lobe masslike opacity (series 4/image 22). Solid 2.0 x
1.3 cm anterior right middle lobe pulmonary nodule abutting the
pleural surface (series 4/image 86). Solid 0.8 cm right middle lobe
satellite pulmonary nodule (series 4/image 86). Peripheral left
upper lobe 0.5 cm solid pulmonary nodule (series 4/image 38).
Sharply marginated mild patchy subpleural reticulation in the
anterior left upper lobe compatible with mild radiation fibrosis.

Upper abdomen: Granulomatous anterior left lower lobe subcentimeter
calcification. Subcentimeter hypodense renal cortical lesion in the
posterior upper right kidney is too small to characterize and
requires no follow-up. Colonic diverticulosis.

Musculoskeletal: No aggressive appearing focal osseous lesions. Left
mastectomy. Mild thoracic spondylosis.
IMPRESSION: 1. Irregular subpleural spiculated 3.9 cm posterior apical right
upper lobe masslike opacity. Primary bronchogenic carcinoma not
excluded.
2. Solid 2.0 cm anterior right middle lobe pulmonary nodule with
adjacent 0.8 cm satellite nodule. Left upper lobe 0.5 cm nodule.
Pulmonary metastases or synchronous primary bronchogenic carcinoma
not excluded.
3. Right hilar, right paratracheal and right prevascular adenopathy,
nodal metastases to be excluded.
4. PET-CT suggested for further evaluation.
5. Small dependent right pleural effusion.
6. Moderate emphysema with diffuse bronchial wall thickening,
suggesting COPD.
7. Aortic Atherosclerosis ([facility].D) and Emphysema ([facility].A).
